# Patient Record
Sex: Female | Born: 1995 | Hispanic: Yes | Marital: Single | Smoking: Never smoker
Health system: Southern US, Community
[De-identification: ages and names within clinical notes are randomized; demographics above are authoritative.]

---
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
## 2018-09-01 ENCOUNTER — Emergency Department (HOSPITAL_COMMUNITY)
Admission: EM | Admit: 2018-09-01 | Discharge: 2018-09-02 | Disposition: A | Discharge status: Home / Self Care | Payer: Self-pay | Attending: Emergency Medicine | Admitting: Emergency Medicine

## 2018-09-01 ENCOUNTER — Other Ambulatory Visit: Payer: Self-pay

## 2018-09-01 ENCOUNTER — Encounter (HOSPITAL_COMMUNITY): Payer: Self-pay | Admitting: Emergency Medicine

## 2018-09-01 DIAGNOSIS — N760 Acute vaginitis: Secondary | ICD-10-CM | POA: Insufficient documentation

## 2018-09-01 DIAGNOSIS — B9689 Other specified bacterial agents as the cause of diseases classified elsewhere: Secondary | ICD-10-CM | POA: Insufficient documentation

## 2018-09-01 NOTE — ED Triage Notes (Addendum)
Patient c/o white vaginal discharge with foul odor x1 week. Reports pelvic pain today. Denies urinary sx. HX BV.

## 2018-09-02 ENCOUNTER — Other Ambulatory Visit: Payer: Self-pay

## 2018-09-02 LAB — RPR: RPR Ser Ql: NONREACTIVE

## 2018-09-02 LAB — WET PREP, GENITAL
SPERM: NONE SEEN
Trich, Wet Prep: NONE SEEN
Yeast Wet Prep HPF POC: NONE SEEN

## 2018-09-02 LAB — GC/CHLAMYDIA PROBE AMP (~~LOC~~) NOT AT ARMC
CHLAMYDIA, DNA PROBE: NEGATIVE
NEISSERIA GONORRHEA: NEGATIVE

## 2018-09-02 LAB — HIV ANTIBODY (ROUTINE TESTING W REFLEX): HIV SCREEN 4TH GENERATION: NONREACTIVE

## 2018-09-02 LAB — POC URINE PREG, ED: Preg Test, Ur: NEGATIVE

## 2018-09-02 MED ORDER — METRONIDAZOLE 500 MG PO TABS: 500.0000 mg | ORAL_TABLET | Freq: Two times a day (BID) | ORAL | 0 refills | Status: DC

## 2018-09-02 MED ORDER — METRONIDAZOLE 500 MG PO TABS
500.0000 mg | ORAL_TABLET | Freq: Once | ORAL | Status: AC
  Administered 2018-09-02: 500 mg via ORAL
  Filled 2018-09-02: qty 1

## 2018-09-02 NOTE — Discharge Instructions (Signed)
Please read the instructions below. Talk with your primary care provider about any new medications. Please schedule an appointment for follow up with your OBGYN or primary care. Finish your antibiotic (Flagyl/Metronidazole) as prescribed. Do not drink alcohol with this medication as it will cause vomiting. You will receive a call from the hospital if your test results come back positive. Avoid sexual activity until you know your test results. If your results come back positive, it is important that you inform all of your sexual partners. Return to the ER for new or worsening symptoms.  ° °

## 2018-09-02 NOTE — ED Provider Notes (Signed)
[facility]-EMERGENCY DEPT Provider Note   CSN: [id number] Arrival date & time: 09/01/18  2218     History   Chief Complaint Chief Complaint  Patient presents with  . Vaginal Discharge    HPI Earlie ServerVictoria Cain is a 22 y.o. female w PMHx bacterial vaginosis, presenting to the ED with 1 week of worsening malodorous vaginal discharge.  She states she has associated vaginal discomfort.  States this feels very typical of her previous episodes of bacterial vaginosis.  She states she gets this frequently.  Denies abdominal pain, urinary symptoms, fever.  She is currently sexually active with female partners without protection.  LMP was mid November.  The history is provided by the patient.    History reviewed. No pertinent past medical history.  There are no active problems to display for this patient.   History reviewed. No pertinent surgical history.   OB History   No obstetric history on file.      Home Medications    Prior to Admission medications   Medication Sig Start Date End Date Taking? Authorizing Provider  metroNIDAZOLE (FLAGYL) 500 MG tablet Take 1 tablet (500 mg total) by mouth 2 (two) times daily. 09/02/18   Alasha Mcguinness, [place] N, PA-C    Family History No family history on file.  Social History Social History   Tobacco Use  . Smoking status: Not on file  Substance Use Topics  . Alcohol use: Not on file  . Drug use: Not on file     Allergies   Patient has no known allergies.   Review of Systems Review of Systems  Constitutional: Negative for fever.  Gastrointestinal: Negative for abdominal pain.  Genitourinary: Positive for vaginal discharge. Negative for dysuria and vaginal bleeding.  All other systems reviewed and are negative.    Physical Exam Updated Vital Signs BP (!) 141/87 (BP Location: Left Arm)   Pulse 88   Temp (!) 97.4 F (36.3 C) (Axillary)   Resp 15   Ht 5\' 2"  (1.575 m)   Wt 81.6 kg   SpO2 100%   BMI  32.92 kg/m   Physical Exam Vitals signs and nursing note reviewed. Exam conducted with a chaperone present (Female nurse tech chaperone present).  Constitutional:      Appearance: She is well-developed.  HENT:     Head: Normocephalic and atraumatic.  Eyes:     Conjunctiva/sclera: Conjunctivae normal.  Cardiovascular:     Rate and Rhythm: Normal rate and regular rhythm.  Pulmonary:     Effort: Pulmonary effort is normal.     Breath sounds: Normal breath sounds.  Abdominal:     General: Bowel sounds are normal.     Palpations: Abdomen is soft.     Tenderness: There is no abdominal tenderness.  Genitourinary:    Cervix: No cervical motion tenderness.     Uterus: Normal.      Adnexa: Right adnexa normal and left adnexa normal.     Comments: Pt with some anxiety regarding pelvic exam, however overall tolerated well. There is a piercing to the mons pubis.  Vaginal erythema present with malodorous white discharge.  Skin:    General: Skin is warm.  Neurological:     Mental Status: She is alert.  Psychiatric:        Behavior: Behavior normal.      ED Treatments / Results  Labs (all labs ordered are listed, but only abnormal results are displayed) Labs Reviewed  WET PREP, GENITAL - Abnormal; Notable  for the following components:      Result Value   Clue Cells Wet Prep HPF POC PRESENT (*)    WBC, Wet Prep HPF POC MODERATE (*)    All other components within normal limits  HIV ANTIBODY (ROUTINE TESTING W REFLEX)  RPR  POC URINE PREG, ED  GC/CHLAMYDIA PROBE AMP ([place]) NOT AT [facility]    EKG None  Radiology No results found.  Procedures Procedures (including critical care time)  Medications Ordered in ED Medications  metroNIDAZOLE (FLAGYL) tablet 500 mg (500 mg Oral Given 09/02/18 0125)     Initial Impression / Assessment and Plan / ED Course  I have reviewed the triage vital signs and the nursing notes.  Pertinent labs & imaging results that were available  during my care of the patient were reviewed by me and considered in my medical decision making (see chart for details).     Pt with hx of recurrent BV, presenting with the same. No systemic sx. Exam with malodorous white discharge. No CMT or adnexal tenderness. Abdomen is benign on exam.  Wet prep with clue cells and moderate white blood cells.  STD culture sent.  Urine pregnant is negative.  Patient deciding to wait for STD cultures to result before any antibiotic treatment.  She is instructed to avoid sexual activity until she knows her results.  Will discharge with Flagyl.  Instructed she take this completely until it is gone.  Instructed to follow-up with her OB/GYN to discuss recurrence of BV and preventative measures.  Agreeable to plan and safe for discharge.  Discussed results, findings, treatment and follow up. Patient advised of return precautions. Patient verbalized understanding and agreed with plan.   Final Clinical Impressions(s) / ED Diagnoses   Final diagnoses:  BV (bacterial vaginosis)    ED Discharge Orders         Ordered    metroNIDAZOLE (FLAGYL) 500 MG tablet  2 times daily     09/02/18 0116           Ramil Edgington, [place] N, PA-C 09/02/18 0141    Gilda Crease, MD 09/02/18 [phone]

## 2018-11-17 ENCOUNTER — Emergency Department (HOSPITAL_COMMUNITY)
Admission: EM | Admit: 2018-11-17 | Discharge: 2018-11-17 | Disposition: A | Discharge status: Home / Self Care | Payer: Medicaid Other | Attending: Emergency Medicine | Admitting: Emergency Medicine

## 2018-11-17 ENCOUNTER — Encounter (HOSPITAL_COMMUNITY): Payer: Self-pay | Admitting: Emergency Medicine

## 2018-11-17 ENCOUNTER — Other Ambulatory Visit: Payer: Self-pay

## 2018-11-17 DIAGNOSIS — E860 Dehydration: Secondary | ICD-10-CM | POA: Insufficient documentation

## 2018-11-17 DIAGNOSIS — Z3A10 10 weeks gestation of pregnancy: Secondary | ICD-10-CM | POA: Diagnosis not present

## 2018-11-17 DIAGNOSIS — O219 Vomiting of pregnancy, unspecified: Secondary | ICD-10-CM | POA: Insufficient documentation

## 2018-11-17 DIAGNOSIS — E876 Hypokalemia: Secondary | ICD-10-CM | POA: Insufficient documentation

## 2018-11-17 DIAGNOSIS — R8279 Other abnormal findings on microbiological examination of urine: Secondary | ICD-10-CM | POA: Diagnosis not present

## 2018-11-17 LAB — COMPREHENSIVE METABOLIC PANEL
ALK PHOS: 64 U/L (ref 38–126)
ALT: 15 U/L (ref 0–44)
AST: 19 U/L (ref 15–41)
Albumin: 4.5 g/dL (ref 3.5–5.0)
Anion gap: 10 (ref 5–15)
BILIRUBIN TOTAL: 0.5 mg/dL (ref 0.3–1.2)
BUN: 7 mg/dL (ref 6–20)
CALCIUM: 9.4 mg/dL (ref 8.9–10.3)
CO2: 23 mmol/L (ref 22–32)
Chloride: 103 mmol/L (ref 98–111)
Creatinine, Ser: 0.5 mg/dL (ref 0.44–1.00)
GFR calc Af Amer: >60 mL/min (ref >60)
Glucose, Bld: 111 mg/dL — ABNORMAL HIGH (ref 70–99)
Potassium: 3.2 mmol/L — ABNORMAL LOW (ref 3.5–5.1)
Sodium: 136 mmol/L (ref 135–145)
TOTAL PROTEIN: 8.2 g/dL — AB (ref 6.5–8.1)

## 2018-11-17 LAB — CBC
HCT: 34.8 % — ABNORMAL LOW (ref 36.0–46.0)
Hemoglobin: 11.3 g/dL — ABNORMAL LOW (ref 12.0–15.0)
MCH: 29.1 pg (ref 26.0–34.0)
MCHC: 32.5 g/dL (ref 30.0–36.0)
MCV: 89.7 fL (ref 80.0–100.0)
Platelets: 283 10*3/uL (ref 150–400)
RBC: 3.88 MIL/uL (ref 3.87–5.11)
RDW: 12.7 % (ref 11.5–15.5)
WBC: 9.7 10*3/uL (ref 4.0–10.5)
nRBC: 0 % (ref 0.0–0.2)

## 2018-11-17 LAB — HCG, QUANTITATIVE, PREGNANCY: HCG, BETA CHAIN, QUANT, S: 203180 m[IU]/mL — AB (ref <5)

## 2018-11-17 LAB — URINALYSIS, ROUTINE W REFLEX MICROSCOPIC
BILIRUBIN URINE: NEGATIVE
Glucose, UA: NEGATIVE mg/dL
HGB URINE DIPSTICK: NEGATIVE
KETONES UR: NEGATIVE mg/dL
Leukocytes,Ua: NEGATIVE
NITRITE: NEGATIVE
Protein, ur: NEGATIVE mg/dL
Specific Gravity, Urine: 1.006 (ref 1.005–1.030)
pH: 6 (ref 5.0–8.0)

## 2018-11-17 LAB — LIPASE, BLOOD: Lipase: 28 U/L (ref 11–51)

## 2018-11-17 MED ORDER — METOCLOPRAMIDE HCL 10 MG PO TABS: 10.0000 mg | ORAL_TABLET | Freq: Four times a day (QID) | ORAL | 0 refills | Status: DC

## 2018-11-17 MED ORDER — ONDANSETRON HCL 4 MG/2ML IJ SOLN
4.0000 mg | Freq: Once | INTRAMUSCULAR | Status: AC
  Administered 2018-11-17: 4 mg via INTRAVENOUS
  Filled 2018-11-17: qty 2

## 2018-11-17 MED ORDER — SODIUM CHLORIDE 0.9 % IV BOLUS
1000.0000 mL | Freq: Once | INTRAVENOUS | Status: AC
  Administered 2018-11-17: 1000 mL via INTRAVENOUS

## 2018-11-17 MED ORDER — POTASSIUM CHLORIDE CRYS ER 20 MEQ PO TBCR
40.0000 meq | EXTENDED_RELEASE_TABLET | Freq: Once | ORAL | Status: AC
  Administered 2018-11-17: 40 meq via ORAL
  Filled 2018-11-17: qty 2

## 2018-11-17 NOTE — Discharge Instructions (Addendum)
Follow-up with your OB/GYN for further evaluation. Return to the ED for worsening symptoms, abdominal pain, abnormal bleeding, lightheadedness, chest pain).

## 2018-11-17 NOTE — ED Provider Notes (Signed)
[facility]-EMERGENCY DEPT Provider Note   CSN: [id number] Arrival date & time: 11/17/18  1642    History   Chief Complaint Chief Complaint  Patient presents with  . Emesis During Pregnancy    HPI Rhonda Cain is a 23 y.o. female who is currently [redacted] weeks pregnant presents to ED with a chief complaint of vomiting. Patient states that she has had no improvement with Unisom or diclegis in the past.  She states that she has nonbloody, nonbilious emesis anytime she tries to eat or drink anything.  Denies any changes to bowel movements, urination, abdominal pain.  She is scheduled for her first OB/GYN appointment in 1 week.  Denies any fevers, shortness of breath.     HPI  No past medical history on file.  There are no active problems to display for this patient.   No past surgical history on file.   OB History    Gravida  1   Para      Term      Preterm      AB      Living        SAB      TAB      Ectopic      Multiple      Live Births               Home Medications    Prior to Admission medications   Medication Sig Start Date End Date Taking? Authorizing Provider  metoCLOPramide (REGLAN) 10 MG tablet Take 1 tablet (10 mg total) by mouth every 6 (six) hours. 11/17/18   Tangie Stay, PA-C  metroNIDAZOLE (FLAGYL) 500 MG tablet Take 1 tablet (500 mg total) by mouth 2 (two) times daily. Patient not taking: Reported on 11/17/2018 09/02/18   Robinson, [place] N, PA-C    Family History No family history on file.  Social History Social History   Tobacco Use  . Smoking status: Not on file  Substance Use Topics  . Alcohol use: Not on file  . Drug use: Not on file     Allergies   Patient has no known allergies.   Review of Systems Review of Systems  Constitutional: Negative for appetite change, chills and fever.  HENT: Negative for ear pain, rhinorrhea, sneezing and sore throat.   Eyes: Negative for photophobia and visual  disturbance.  Respiratory: Negative for cough, chest tightness, shortness of breath and wheezing.   Cardiovascular: Negative for chest pain and palpitations.  Gastrointestinal: Positive for nausea and vomiting. Negative for abdominal pain, blood in stool, constipation and diarrhea.  Genitourinary: Negative for dysuria, hematuria and urgency.  Musculoskeletal: Negative for myalgias.  Skin: Negative for rash.  Neurological: Negative for dizziness, weakness and light-headedness.     Physical Exam Updated Vital Signs BP 112/87   Pulse 84   Temp 98.2 F (36.8 C)   Resp 15   LMP 09/11/2018   SpO2 100%   Physical Exam Vitals signs and nursing note reviewed.  Constitutional:      General: She is not in acute distress.    Appearance: She is well-developed.  HENT:     Head: Normocephalic and atraumatic.     Nose: Nose normal.  Eyes:     General: No scleral icterus.       Right eye: No discharge.        Left eye: No discharge.     Conjunctiva/sclera: Conjunctivae normal.  Neck:     Musculoskeletal: Normal  range of motion and neck supple.  Cardiovascular:     Rate and Rhythm: Normal rate and regular rhythm.     Heart sounds: Normal heart sounds. No murmur. No friction rub. No gallop.   Pulmonary:     Effort: Pulmonary effort is normal. No respiratory distress.     Breath sounds: Normal breath sounds.  Abdominal:     General: Bowel sounds are normal. There is no distension.     Palpations: Abdomen is soft.     Tenderness: There is no abdominal tenderness. There is no guarding.  Musculoskeletal: Normal range of motion.  Skin:    General: Skin is warm and dry.     Findings: No rash.  Neurological:     Mental Status: She is alert.     Motor: No abnormal muscle tone.     Coordination: Coordination normal.      ED Treatments / Results  Labs (all labs ordered are listed, but only abnormal results are displayed) Labs Reviewed  COMPREHENSIVE METABOLIC PANEL - Abnormal;  Notable for the following components:      Result Value   Potassium 3.2 (*)    Glucose, Bld 111 (*)    Total Protein 8.2 (*)    All other components within normal limits  CBC - Abnormal; Notable for the following components:   Hemoglobin 11.3 (*)    HCT 34.8 (*)    All other components within normal limits  URINALYSIS, ROUTINE W REFLEX MICROSCOPIC - Abnormal; Notable for the following components:   APPearance HAZY (*)    All other components within normal limits  HCG, QUANTITATIVE, PREGNANCY - Abnormal; Notable for the following components:   hCG, Beta Chain, Quant, S 203,180 (*)    All other components within normal limits  URINE CULTURE  LIPASE, BLOOD    EKG None  Radiology No results found.  Procedures Procedures (including critical care time)  Medications Ordered in ED Medications  sodium chloride 0.9 % bolus 1,000 mL (0 mLs Intravenous Stopped 11/17/18 2058)  ondansetron (ZOFRAN) injection 4 mg (4 mg Intravenous Given 11/17/18 2013)  potassium chloride SA (K-DUR,KLOR-CON) CR tablet 40 mEq (40 mEq Oral Given 11/17/18 2105)     Initial Impression / Assessment and Plan / ED Course  I have reviewed the triage vital signs and the nursing notes.  Pertinent labs & imaging results that were available during my care of the patient were reviewed by me and considered in my medical decision making (see chart for details).  Clinical Course as of Nov 16 2205  Wynelle Link Nov 17, 2018  1727 Potassium(!): 3.2 [HK]  2117 Patient with improvement in her symptoms with fluids and Zofran given.  She is resting comfortably.  She is requesting p.o. challenge.   [HK]  2155 Leukocytes,Ua: NEGATIVE [HK]  2155 Nitrite: NEGATIVE [HK]    Clinical Course User Index [HK] Dietrich Pates, PA-C       23 year old female who is currently [redacted] weeks pregnant presents to ED for morning sickness and ongoing nausea and vomiting.  Patient is denying any abdominal pain or abnormal vaginal bleeding.  No improvement  with Diclegis taken previously.  On exam she is overall well-appearing.  Abdomen is soft, nontender nondistended.  Vital signs are within normal limits.  Lab work significant for hCG of 200,000, urinalysis unremarkable.  CBC unremarkable.  CMP shows potassium of 3.2 which was repleted orally.  Lipase is normal.  Urine sent for culture although no signs of infection noted.  Patient was  given fluids, Zofran with improvement in her symptoms.  Suspect that the symptoms are due to her pregnancy.  She was able to tolerate a sandwich and ginger ale here.  She has an appointment with her OB/GYN in 1 week.  Will give as needed Reglan.  Will have her return to ED for any severe or worsening symptoms.   Patient is hemodynamically stable, in NAD, and able to ambulate in the ED. Evaluation does not show pathology that would require ongoing emergent intervention or inpatient treatment. I explained the diagnosis to the patient. Pain has been managed and has no complaints prior to discharge. Patient is comfortable with above plan and is stable for discharge at this time. All questions were answered prior to disposition. Strict return precautions for returning to the ED were discussed. Encouraged follow up with PCP.    Portions of this note were generated with Scientist, clinical (histocompatibility and immunogenetics). Dictation errors may occur despite best attempts at proofreading.   Final Clinical Impressions(s) / ED Diagnoses   Final diagnoses:  Nausea and vomiting in pregnancy  Hypokalemia    ED Discharge Orders         Ordered    metoCLOPramide (REGLAN) 10 MG tablet  Every 6 hours     11/17/18 2158           Dietrich Pates, PA-C 11/17/18 2207    Lorre Nick, MD 11/18/18 2303

## 2018-11-17 NOTE — ED Triage Notes (Signed)
Pt reports that she is about [redacted] weeks pregnant and been vomiting. Reports that medication she was prescribed isnt in stock.

## 2018-11-19 LAB — URINE CULTURE

## 2019-04-03 ENCOUNTER — Encounter (HOSPITAL_COMMUNITY): Payer: Self-pay

## 2019-04-03 ENCOUNTER — Emergency Department (HOSPITAL_COMMUNITY)
Admission: EM | Admit: 2019-04-03 | Discharge: 2019-04-03 | Disposition: A | Discharge status: Home / Self Care | Payer: Medicaid Other | Attending: Emergency Medicine | Admitting: Emergency Medicine

## 2019-04-03 ENCOUNTER — Other Ambulatory Visit: Payer: Self-pay

## 2019-04-03 DIAGNOSIS — R51 Headache: Secondary | ICD-10-CM | POA: Insufficient documentation

## 2019-04-03 DIAGNOSIS — Z5321 Procedure and treatment not carried out due to patient leaving prior to being seen by health care provider: Secondary | ICD-10-CM | POA: Diagnosis not present

## 2019-04-03 NOTE — ED Triage Notes (Signed)
Pt stating she has a severe headache that started this morning when she woke up, denies taking any medication at home. Pt also states she is [redacted] weeks pregnant and has not felt her son move since mid day yesterday and now has abdominal pain. Pt denies any vaginal bleeding.

## 2019-04-03 NOTE — ED Notes (Signed)
Patient said that this was taking too long and decided to leave. Charge RN explained leaving AMA, patient understood and decided to leave.

## 2019-04-03 NOTE — ED Provider Notes (Signed)
I went to evaluate the patient who arrived at [facility] and upon entering her room, found it to be empty after signing up for the patient at [facility].  Spoke with the patient's nurse who reports that the patient decided to leave AMA.  I did not see or evaluate this patient prior to her leaving the ER. Charge nurse reviewed AMA guidelines with the patient prior to her leaving the department.    Joanne Gavel, PA-C 04/03/19 Lyndee Leo, April, MD 04/03/19 [phone]

## 2021-01-04 ENCOUNTER — Other Ambulatory Visit: Payer: Self-pay

## 2021-01-04 ENCOUNTER — Emergency Department (HOSPITAL_COMMUNITY)
Admission: EM | Admit: 2021-01-04 | Discharge: 2021-01-04 | Disposition: A | Discharge status: Home / Self Care | Payer: Medicaid Other | Attending: Emergency Medicine | Admitting: Emergency Medicine

## 2021-01-04 ENCOUNTER — Emergency Department (HOSPITAL_COMMUNITY): Payer: Medicaid Other

## 2021-01-04 ENCOUNTER — Encounter (HOSPITAL_COMMUNITY): Payer: Self-pay

## 2021-01-04 DIAGNOSIS — Z3A01 Less than 8 weeks gestation of pregnancy: Secondary | ICD-10-CM | POA: Diagnosis not present

## 2021-01-04 DIAGNOSIS — Z3491 Encounter for supervision of normal pregnancy, unspecified, first trimester: Secondary | ICD-10-CM

## 2021-01-04 DIAGNOSIS — O26851 Spotting complicating pregnancy, first trimester: Secondary | ICD-10-CM | POA: Insufficient documentation

## 2021-01-04 DIAGNOSIS — R109 Unspecified abdominal pain: Secondary | ICD-10-CM

## 2021-01-04 DIAGNOSIS — O219 Vomiting of pregnancy, unspecified: Secondary | ICD-10-CM | POA: Diagnosis not present

## 2021-01-04 DIAGNOSIS — U071 COVID-19: Secondary | ICD-10-CM | POA: Diagnosis not present

## 2021-01-04 DIAGNOSIS — O98511 Other viral diseases complicating pregnancy, first trimester: Secondary | ICD-10-CM | POA: Diagnosis not present

## 2021-01-04 DIAGNOSIS — Z28310 Unvaccinated for covid-19: Secondary | ICD-10-CM | POA: Diagnosis not present

## 2021-01-04 LAB — URINALYSIS, ROUTINE W REFLEX MICROSCOPIC
Bilirubin Urine: NEGATIVE
Glucose, UA: NEGATIVE mg/dL
Hgb urine dipstick: NEGATIVE
Ketones, ur: NEGATIVE mg/dL
Leukocytes,Ua: NEGATIVE
Nitrite: NEGATIVE
Protein, ur: NEGATIVE mg/dL
Specific Gravity, Urine: 1.009 (ref 1.005–1.030)
pH: 7 (ref 5.0–8.0)

## 2021-01-04 LAB — PREGNANCY, URINE: Preg Test, Ur: POSITIVE — AB

## 2021-01-04 LAB — RESP PANEL BY RT-PCR (FLU A&B, COVID) ARPGX2
Influenza A by PCR: NEGATIVE
Influenza B by PCR: NEGATIVE
SARS Coronavirus 2 by RT PCR: POSITIVE — AB

## 2021-01-04 LAB — HCG, QUANTITATIVE, PREGNANCY: hCG, Beta Chain, Quant, S: 22905 m[IU]/mL — ABNORMAL HIGH (ref <5)

## 2021-01-04 MED ORDER — ONDANSETRON 4 MG PO TBDP: 4.0000 mg | ORAL_TABLET | Freq: Three times a day (TID) | ORAL | 0 refills | Status: AC | PRN

## 2021-01-04 MED ORDER — SODIUM CHLORIDE 0.9 % IV SOLN: INTRAVENOUS | Status: AC | PRN

## 2021-01-04 MED ORDER — METHYLPREDNISOLONE SODIUM SUCC 125 MG IJ SOLR: 125.0000 mg | Freq: Once | INTRAMUSCULAR | Status: AC | PRN

## 2021-01-04 MED ORDER — ALBUTEROL SULFATE HFA 108 (90 BASE) MCG/ACT IN AERS: 2.0000 | INHALATION_SPRAY | Freq: Once | RESPIRATORY_TRACT | Status: AC | PRN

## 2021-01-04 MED ORDER — ONDANSETRON HCL 4 MG/2ML IJ SOLN
4.0000 mg | Freq: Once | INTRAMUSCULAR | Status: AC
  Administered 2021-01-04: 4 mg via INTRAVENOUS
  Filled 2021-01-04: qty 2

## 2021-01-04 MED ORDER — FAMOTIDINE IN NACL 20-0.9 MG/50ML-% IV SOLN: 20.0000 mg | Freq: Once | INTRAVENOUS | Status: AC | PRN

## 2021-01-04 MED ORDER — SODIUM CHLORIDE 0.9 % IV BOLUS
1000.0000 mL | Freq: Once | INTRAVENOUS | Status: AC
  Administered 2021-01-04: 1000 mL via INTRAVENOUS

## 2021-01-04 MED ORDER — DIPHENHYDRAMINE HCL 50 MG/ML IJ SOLN: 50.0000 mg | Freq: Once | INTRAMUSCULAR | Status: AC | PRN

## 2021-01-04 MED ORDER — ACETAMINOPHEN 325 MG PO TABS
650.0000 mg | ORAL_TABLET | Freq: Once | ORAL | Status: AC
  Administered 2021-01-04: 650 mg via ORAL
  Filled 2021-01-04: qty 2

## 2021-01-04 MED ORDER — BEBTELOVIMAB 175 MG/2 ML IV (EUA)
175.0000 mg | Freq: Once | INTRAMUSCULAR | Status: AC
  Administered 2021-01-04: 175 mg via INTRAVENOUS
  Filled 2021-01-04: qty 2

## 2021-01-04 MED ORDER — EPINEPHRINE 0.3 MG/0.3ML IJ SOAJ: 0.3000 mg | Freq: Once | INTRAMUSCULAR | Status: AC | PRN

## 2021-01-04 NOTE — ED Notes (Signed)
Pt was seen returning from the bathroom. Pt informed the need to stay in her room due to being covid positive and that a bedside commode will be brought to her.

## 2021-01-04 NOTE — ED Provider Notes (Signed)
[facility]-EMERGENCY DEPT Provider Note   CSN: [id number] Arrival date & time: 01/04/21  0417     History Chief Complaint  Patient presents with  . Vaginal Bleeding  . Vomiting  . Headache    Rhonda Cain is a 25 y.o. female.  HPI     This is a 25 year old G5, P17 female currently 5 to [redacted] weeks pregnant with last menstrual period November 23, 2020 who presents with nausea, vomiting, headache, and vaginal spotting.  Patient reports 2 to 3 days history of worsening symptoms.  She states she is unable to keep anything down.  She has noticed light vaginal spotting and abdominal cramping.  This does not lateralize.  She also reports headache.  She reports that she had a positive pregnancy test 2 weeks ago but has not seen an OB/GYN.  She denies any urinary symptoms but does report she was recently treated for UTI.  She reports body aches but did not note any fevers.  She was 100.2 upon arrival.  No known sick contacts or COVID exposures.  She is not vaccinated.  History reviewed. No pertinent past medical history.  There are no problems to display for this patient.   History reviewed. No pertinent surgical history.   OB History    Gravida  1   Para      Term      Preterm      AB      Living        SAB      IAB      Ectopic      Multiple      Live Births              No family history on file.  Social History   Tobacco Use  . Smoking status: Never Smoker  . Smokeless tobacco: Never Used    Home Medications Prior to Admission medications   Medication Sig Start Date End Date Taking? Authorizing Provider  metoCLOPramide (REGLAN) 10 MG tablet Take 1 tablet (10 mg total) by mouth every 6 (six) hours. 11/17/18   Khatri, Hina, PA-C  metroNIDAZOLE (FLAGYL) 500 MG tablet Take 1 tablet (500 mg total) by mouth 2 (two) times daily. Patient not taking: Reported on 11/17/2018 09/02/18   Robinson, [place] N, PA-C    Allergies    Patient has no  known allergies.  Review of Systems   Review of Systems  Constitutional: Positive for chills and fever.  Respiratory: Negative for shortness of breath.   Cardiovascular: Negative for chest pain.  Gastrointestinal: Positive for abdominal pain, nausea and vomiting.  Genitourinary: Positive for vaginal bleeding.  Neurological: Positive for headaches.  All other systems reviewed and are negative.   Physical Exam Updated Vital Signs BP 133/73   Pulse (!) 112   Temp 100.2 F (37.9 C) (Oral)   Resp 18   Ht 1.575 m (5\' 2" )   Wt 90.7 kg   SpO2 100%   BMI 36.58 kg/m   Physical Exam Vitals and nursing note reviewed.  Constitutional:      Appearance: She is well-developed. She is not ill-appearing.  HENT:     Head: Normocephalic and atraumatic.  Eyes:     Pupils: Pupils are equal, round, and reactive to light.  Cardiovascular:     Rate and Rhythm: Normal rate and regular rhythm.     Heart sounds: Normal heart sounds.  Pulmonary:     Effort: Pulmonary effort is normal. No respiratory  distress.     Breath sounds: No wheezing.  Abdominal:     General: Bowel sounds are normal.     Palpations: Abdomen is soft.     Tenderness: There is abdominal tenderness.     Comments: Diffuse lower abdominal tenderness to palpation, nonlateralizing, no rebound or guarding  Musculoskeletal:     Cervical back: Neck supple.  Skin:    General: Skin is warm and dry.  Neurological:     Mental Status: She is alert and oriented to person, place, and time.  Psychiatric:        Mood and Affect: Mood normal.     ED Results / Procedures / Treatments   Labs (all labs ordered are listed, but only abnormal results are displayed) Labs Reviewed  RESP PANEL BY RT-PCR (FLU A&B, COVID) ARPGX2 - Abnormal; Notable for the following components:      Result Value   SARS Coronavirus 2 by RT PCR POSITIVE (*)    All other components within normal limits  URINALYSIS, ROUTINE W REFLEX MICROSCOPIC - Abnormal;  Notable for the following components:   APPearance HAZY (*)    All other components within normal limits  HCG, QUANTITATIVE, PREGNANCY - Abnormal; Notable for the following components:   hCG, Beta Chain, Quant, S 22,905 (*)    All other components within normal limits  PREGNANCY, URINE - Abnormal; Notable for the following components:   Preg Test, Ur POSITIVE (*)    All other components within normal limits  URINE CULTURE    EKG None  Radiology US OB LESS THAN 14 WEEKS WITH OB TRANSVAGINAL  Result Date: 01/04/2021 CLINICAL DATA:  Nausea vomiting.  Spotting.  Abdominal pain. EXAM: OBSTETRIC <14 WK US AND TRANSVAGINAL OB US TECHNIQUE: Both transabdominal and transvaginal ultrasound examinations were performed for complete evaluation of the gestation as well as the maternal uterus, adnexal regions, and pelvic cul-de-sac. Transvaginal technique was performed to assess early pregnancy. COMPARISON:  No prior. FINDINGS: Intrauterine gestational sac: Single Yolk sac:  Present Embryo:  Present Cardiac Activity: Present Heart Rate: 105 bpm CRL: 2 mm   5 w   5 d                  US EDC: 09/01/2021 Subchorionic hemorrhage:  None visualized. Maternal uterus/adnexae: Small amount of free pelvic fluid. Otherwise unremarkable. IMPRESSION: 1.  Single viable intrauterine pregnancy at 5 weeks 5 days. 2.  Small amount of free fluid. Electronically Signed   By: Maisie Fushomas  Register   On: 01/04/2021 06:04    Procedures Procedures   Medications Ordered in ED Medications  bebtelovimab EUA injection SOLN 175 mg (has no administration in time range)  0.9 %  sodium chloride infusion (has no administration in time range)  diphenhydrAMINE (BENADRYL) injection 50 mg (has no administration in time range)  famotidine (PEPCID) IVPB 20 mg premix (has no administration in time range)  methylPREDNISolone sodium succinate (SOLU-MEDROL) 125 mg/2 mL injection 125 mg (has no administration in time range)  albuterol (VENTOLIN  HFA) 108 (90 Base) MCG/ACT inhaler 2 puff (has no administration in time range)  EPINEPHrine (EPI-PEN) injection 0.3 mg (has no administration in time range)  sodium chloride 0.9 % bolus 1,000 mL (1,000 mLs Intravenous New Bag/Given 01/04/21 0520)  ondansetron (ZOFRAN) injection 4 mg (4 mg Intravenous Given 01/04/21 0520)  acetaminophen (TYLENOL) tablet 650 mg (650 mg Oral Given 01/04/21 0520)    ED Course  I have reviewed the triage vital signs and the nursing notes.  Pertinent labs & imaging results that were available during my care of the patient were reviewed by me and considered in my medical decision making (see chart for details).    MDM Rules/Calculators/A&P                          Patient presents with vaginal spotting, chills, fever, headache.  She is overall nontoxic.  Temperature 100.2.  Pulse rate in the 110s.  She is nontoxic on exam.  She has no lateralizing abdominal tenderness.  However, given abnormal spotting and abdominal pain, will obtain an ultrasound to ensure intrauterine pregnancy.  Labs obtained including beta hCG, COVID and flu testing, and urinalysis.  Urine without evidence of UTI.  Beta-hCG greater than 22,000.  Patient is COVID-positive.  She is unvaccinated.  She is within the window for monoclonal antibody therapy.  She is agreeable to receiving this.  This was ordered through the pharmacy.  Patient was advised to quarantine information.  She was also given return precautions.  She was given return precautions for both COVID-19 and worsening vaginal bleeding with pregnancy.   Final Clinical Impression(s) / ED Diagnoses Final diagnoses:  Abdominal pain  COVID-19  First trimester pregnancy    Rx / DC Orders ED Discharge Orders         Ordered    SODIUM CHLORIDE 0.9 % IV SOLN  As needed        01/04/21 0618    DIPHENHYDRAMINE HCL 50 MG/ML IJ SOLN  Once PRN        01/04/21 0618    FAMOTIDINE IN NACL 20-0.9 MG/50ML-% IV SOLN  Once PRN        01/04/21 0618     METHYLPREDNISOLONE SODIUM SUCC 125 MG IJ SOLR  Once PRN        01/04/21 0618    ALBUTEROL SULFATE HFA 108 (90 BASE) MCG/ACT IN AERS  Once PRN        01/04/21 0618    EPINEPHRINE 0.3 MG/0.3ML IJ SOAJ  Once PRN        01/04/21 0618    Hypersensitivity GRADE 1: Transient flushing or rash, or drug fever < 100.4 F       Comments: As needed  Hold infusion for at least 30 minutes Restart infusion at 50% infusion rate if asymptomatic   01/04/21 5638    Hypersensitivity GRADE 2: Rash, flushing, urticaria, dyspnea, or drug fever = or > 100.4 F       Comments: As needed  Hold infusion for 30 minutes Give medications as ordered   01/04/21 7564    Hypersensitivity GRADE 3: symptomatic bronchospasm, with or without urticaria, parenteral medication management indicated, allergy-related edema/angioedema, or hypotension       Comments: Routine, As needed For Until specified Hold infusion for at least 30 minutes Give medications as ordered   01/04/21 0618    Hypersensitivity GRADE 4: Anaphylaxis       Comments: As needed  Hold infusion Give medications as ordered Initiate fetal monitoring/fetal heart rate assessment as appropriate for gestational age Call MD   01/04/21 0618    Fetal monitoring       Comments: For Grade 4 hypersensitivity reaction and gestational age greater than or equal to 23 weeks   01/04/21 0618    Assess fetal heart tones       Comments: For Grade 4 hypersensitivity reactions and gestational age less than 23 weeks   01/04/21 0618  Shon Baton, MD 01/04/21 [phone]

## 2021-01-04 NOTE — ED Provider Notes (Signed)
  Physical Exam  BP 135/77 (BP Location: Left Arm)   Pulse (!) 106   Temp 100.2 F (37.9 C) (Oral)   Resp 17   Ht 5\' 2"  (1.575 m)   Wt 90.7 kg   SpO2 99%   BMI 36.58 kg/m   Physical Exam  ED Course/Procedures     Procedures  MDM  Awaiting monoclonal antibody infusion. Completed monoclonal antibody infusion.  She will be discharged home.       , MD 01/04/21 [phone]

## 2021-01-04 NOTE — Discharge Instructions (Addendum)
You were seen today and found to be COVID-19 positive.  Your ultrasound confirms a intrauterine pregnancy at 5 weeks and 5 days.  You received monoclonal antibody therapy.  You will need to quarantine for 10 days and be symptom-free for greater than 24 hours.  Regarding your pregnancy, if you develop worsening abdominal pain, passage of clots or worsening bleeding, you need to be reevaluated immediately.

## 2021-01-04 NOTE — ED Triage Notes (Signed)
Pt reports nausea, vomiting and headache for roughly 4 days. Has been unable to hold anything down for 3. Pt sts spotting for 4 days also. Reports being approximately 5-[redacted] weeks pregnant.

## 2021-01-05 LAB — URINE CULTURE

## 2021-06-26 ENCOUNTER — Other Ambulatory Visit: Payer: Self-pay

## 2021-06-26 ENCOUNTER — Emergency Department (HOSPITAL_COMMUNITY)
Admission: EM | Admit: 2021-06-26 | Discharge: 2021-06-26 | Disposition: A | Discharge status: Home / Self Care | Payer: Medicaid Other | Attending: Emergency Medicine | Admitting: Emergency Medicine

## 2021-06-26 ENCOUNTER — Emergency Department (HOSPITAL_COMMUNITY): Payer: Medicaid Other

## 2021-06-26 ENCOUNTER — Encounter (HOSPITAL_COMMUNITY): Payer: Self-pay

## 2021-06-26 DIAGNOSIS — O4690 Antepartum hemorrhage, unspecified, unspecified trimester: Secondary | ICD-10-CM

## 2021-06-26 DIAGNOSIS — O200 Threatened abortion: Secondary | ICD-10-CM | POA: Diagnosis present

## 2021-06-26 DIAGNOSIS — O2341 Unspecified infection of urinary tract in pregnancy, first trimester: Secondary | ICD-10-CM | POA: Diagnosis not present

## 2021-06-26 DIAGNOSIS — Z3A00 Weeks of gestation of pregnancy not specified: Secondary | ICD-10-CM | POA: Diagnosis not present

## 2021-06-26 LAB — CBC WITH DIFFERENTIAL/PLATELET
Abs Immature Granulocytes: 0.03 10*3/uL (ref 0.00–0.07)
Basophils Absolute: 0 10*3/uL (ref 0.0–0.1)
Basophils Relative: 0 %
Eosinophils Absolute: 0.2 10*3/uL (ref 0.0–0.5)
Eosinophils Relative: 2 %
HCT: 32.8 % — ABNORMAL LOW (ref 36.0–46.0)
Hemoglobin: 10.7 g/dL — ABNORMAL LOW (ref 12.0–15.0)
Immature Granulocytes: 0 %
Lymphocytes Relative: 29 %
Lymphs Abs: 2.7 10*3/uL (ref 0.7–4.0)
MCH: 28.8 pg (ref 26.0–34.0)
MCHC: 32.6 g/dL (ref 30.0–36.0)
MCV: 88.4 fL (ref 80.0–100.0)
Monocytes Absolute: 0.6 10*3/uL (ref 0.1–1.0)
Monocytes Relative: 7 %
Neutro Abs: 5.7 10*3/uL (ref 1.7–7.7)
Neutrophils Relative %: 62 %
Platelets: 311 10*3/uL (ref 150–400)
RBC: 3.71 MIL/uL — ABNORMAL LOW (ref 3.87–5.11)
RDW: 13.8 % (ref 11.5–15.5)
WBC: 9.3 10*3/uL (ref 4.0–10.5)
nRBC: 0 % (ref 0.0–0.2)

## 2021-06-26 LAB — URINALYSIS, ROUTINE W REFLEX MICROSCOPIC
Bacteria, UA: NONE SEEN
Bilirubin Urine: NEGATIVE
Glucose, UA: NEGATIVE mg/dL
Hgb urine dipstick: NEGATIVE
Ketones, ur: NEGATIVE mg/dL
Nitrite: NEGATIVE
Protein, ur: NEGATIVE mg/dL
Specific Gravity, Urine: 1.021 (ref 1.005–1.030)
pH: 5 (ref 5.0–8.0)

## 2021-06-26 LAB — BASIC METABOLIC PANEL
Anion gap: 8 (ref 5–15)
BUN: 10 mg/dL (ref 6–20)
CO2: 22 mmol/L (ref 22–32)
Calcium: 9.1 mg/dL (ref 8.9–10.3)
Chloride: 107 mmol/L (ref 98–111)
Creatinine, Ser: 0.57 mg/dL (ref 0.44–1.00)
GFR, Estimated: >60 mL/min (ref >60)
Glucose, Bld: 99 mg/dL (ref 70–99)
Potassium: 3.9 mmol/L (ref 3.5–5.1)
Sodium: 137 mmol/L (ref 135–145)

## 2021-06-26 LAB — HCG, QUANTITATIVE, PREGNANCY: hCG, Beta Chain, Quant, S: 1286 m[IU]/mL — ABNORMAL HIGH (ref <5)

## 2021-06-26 NOTE — ED Triage Notes (Signed)
Pt reports that she found out she was pregnant a few weeks ago, unknown of exact LMP and she has been bleeding and cramping for 2 days.

## 2021-06-26 NOTE — Discharge Instructions (Signed)
Call your OB/GYN tomorrow to let them know you are seen here today.  You need follow-up within the next 48 hours for repeat hCG levels.  As discussed in the room your ultrasound showed a gestational sac however no fetal cardiac activity or yolk sac.  This ultrasound essentially nonconclusive which is why you need close OB/GYN follow-up.  Your urine did show infection.  I have started you on antibiotics.  Return for new or worsening symptoms.

## 2021-06-26 NOTE — ED Provider Notes (Signed)
Emergency Medicine Provider Triage Evaluation Note  Rhonda Cain , a 25 y.o. female  was evaluated in triage.  Pt complains of vaginal bleeding for 3 days.  She recently had a positive at-home pregnancy test 2 weeks ago.  Has not established care with GYN yet.  Unknown exact LMP.  Also endorses intermittent bilateral lower abdominal cramping that started yesterday.  She is bleeding through 3-4 panty liners daily.  Review of Systems  Positive: Vaginal bleeding, abdominal cramping Negative: Fevers, chills, dysuria  Physical Exam  BP (!) 158/85 (BP Location: Right Arm)   Pulse 100   Temp 99.3 F (37.4 C) (Oral)   Resp 16   Ht 5\' 3"  (1.6 m)   Wt 95.3 kg   SpO2 100%   BMI 37.20 kg/m  Gen:   Awake, no distress   Resp:  Normal effort  MSK:   Moves extremities without difficulty  Other:    Medical Decision Making  Medically screening exam initiated at 2:21 PM.  Appropriate orders placed.  Elexis Pollak was informed that the remainder of the evaluation will be completed by another provider, this initial triage assessment does not replace that evaluation, and the importance of remaining in the ED until their evaluation is complete.     Earlie Server 06/26/21 1423    Long12/09/22, MD 06/27/21 [phone]

## 2021-06-26 NOTE — ED Notes (Signed)
US at bedside

## 2021-06-26 NOTE — ED Provider Notes (Signed)
[facility]-EMERGENCY DEPT Provider Note   CSN: [id number] Arrival date & time: 06/26/21  1350    History Chief Complaint  Patient presents with   Vaginal Bleeding    Rhonda Cain is a 25 y.o. female with recent past medical history who presents for evaluation of vaginal bleeding in setting of positive at home pregnancy test.  Unsure last menstrual cycle of these are regular.  Had a positive test 2 weeks ago.  Previous pregnancies she is followed by [facility].  Not actively being followed.  She is G6, P1 currently.  She has had some intermittent lower abdominal cramping and some intermittent vaginal bleeding.  She has been wearing panty liners.  Changing approximately 3 times daily.  She has no current abdominal pain.  No fever, chills, emesis, back pain, dysuria, hematuria, lightheadedness or dizziness.  Denies additional aggravating or alleviating factors  History obtained from patient and past medical records.  No interpreter used.  Chart review MBT O positive>> Does not need Rhogam  HPI     History reviewed. No pertinent past medical history.  There are no problems to display for this patient.   History reviewed. No pertinent surgical history.   OB History     Gravida  1   Para      Term      Preterm      AB      Living         SAB      IAB      Ectopic      Multiple      Live Births              History reviewed. No pertinent family history.  Social History   Tobacco Use   Smoking status: Never   Smokeless tobacco: Never    Home Medications Prior to Admission medications   Medication Sig Start Date End Date Taking? Authorizing Provider  ondansetron (ZOFRAN ODT) 4 MG disintegrating tablet Take 1 tablet (4 mg total) by mouth every 8 (eight) hours as needed for nausea or vomiting. 01/04/21   Koleen Distance, MD    Allergies    Patient has no known allergies.  Review of Systems   Review of Systems   Constitutional: Negative.   HENT: Negative.    Respiratory: Negative.    Cardiovascular: Negative.   Gastrointestinal:  Positive for abdominal pain. Negative for abdominal distention, constipation, diarrhea, nausea, rectal pain and vomiting.  Genitourinary:  Positive for menstrual problem. Negative for decreased urine volume, difficulty urinating, dysuria, enuresis, flank pain, frequency, genital sores, hematuria, pelvic pain, urgency, vaginal bleeding, vaginal discharge and vaginal pain.  Musculoskeletal: Negative.   Skin: Negative.   Neurological: Negative.   All other systems reviewed and are negative.  Physical Exam Updated Vital Signs BP (!) 158/85 (BP Location: Right Arm)   Pulse 100   Temp 99.3 F (37.4 C) (Oral)   Resp 16   Ht 5\' 3"  (1.6 m)   Wt 95.3 kg   SpO2 100%   BMI 37.20 kg/m   Physical Exam Vitals and nursing note reviewed.  Constitutional:      General: She is not in acute distress.    Appearance: She is well-developed. She is not ill-appearing, toxic-appearing or diaphoretic.  HENT:     Head: Normocephalic and atraumatic.     Nose: Nose normal.     Mouth/Throat:     Mouth: Mucous membranes are moist.  Eyes:  Pupils: Pupils are equal, round, and reactive to light.  Cardiovascular:     Rate and Rhythm: Normal rate.     Pulses: Normal pulses.     Heart sounds: Normal heart sounds.  Pulmonary:     Effort: No respiratory distress.  Abdominal:     General: Bowel sounds are normal. There is no distension.     Palpations: Abdomen is soft.  Genitourinary:    Comments: Declined Musculoskeletal:        General: Normal range of motion.     Cervical back: Normal range of motion.  Skin:    General: Skin is warm and dry.  Neurological:     General: No focal deficit present.     Mental Status: She is alert.  Psychiatric:        Mood and Affect: Mood normal.    ED Results / Procedures / Treatments   Labs (all labs ordered are listed, but only abnormal  results are displayed) Labs Reviewed  URINALYSIS, ROUTINE W REFLEX MICROSCOPIC - Abnormal; Notable for the following components:      Result Value   APPearance CLOUDY (*)    Leukocytes,Ua LARGE (*)    All other components within normal limits  HCG, QUANTITATIVE, PREGNANCY - Abnormal; Notable for the following components:   hCG, Beta Chain, Quant, S 1,286 (*)    All other components within normal limits  CBC WITH DIFFERENTIAL/PLATELET - Abnormal; Notable for the following components:   RBC 3.71 (*)    Hemoglobin 10.7 (*)    HCT 32.8 (*)    All other components within normal limits  BASIC METABOLIC PANEL    EKG None  Radiology US OB Comp < 14 Wks  Result Date: 06/26/2021 CLINICAL DATA:  Bleeding and pelvic pain for 3 days, beta HCG 1,286 EXAM: OBSTETRIC <14 WK [place] AND TRANSVAGINAL OB US TECHNIQUE: Both transabdominal and transvaginal ultrasound examinations were performed for complete evaluation of the gestation as well as the maternal uterus, adnexal regions, and pelvic cul-de-sac. Transvaginal technique was performed to assess early pregnancy. COMPARISON:  None. FINDINGS: Intrauterine gestational sac: Single Yolk sac:  Not Visualized. Embryo:  Not Visualized. Cardiac Activity: Not Visualized. MSD: Too small for accurate measurement Maternal uterus/adnexae: Right ovary measures 3.3 x 2.0 x 3.0 cm and the left ovary measures 3.7 x 1.9 x 2.1 cm. Trace pelvic free fluid. IMPRESSION: 1. Probable early intrauterine gestational sac, but no yolk sac, fetal pole, or cardiac activity yet visualized. Recommend follow-up quantitative B-HCG levels and follow-up US in 14 days to assess viability. This recommendation follows SRU consensus guidelines: Diagnostic Criteria for Nonviable Pregnancy Early in the First Trimester. Malva Limes Med 2013; 161:0960-45. 2. Trace pelvic free fluid. Electronically Signed   By: Sharlet Salina M.D.   On: 06/26/2021 16:45   US OB Transvaginal  Result Date:  06/26/2021 CLINICAL DATA:  Bleeding and pelvic pain for 3 days, beta HCG 1,286 EXAM: OBSTETRIC <14 WK [place] AND TRANSVAGINAL OB US TECHNIQUE: Both transabdominal and transvaginal ultrasound examinations were performed for complete evaluation of the gestation as well as the maternal uterus, adnexal regions, and pelvic cul-de-sac. Transvaginal technique was performed to assess early pregnancy. COMPARISON:  None. FINDINGS: Intrauterine gestational sac: Single Yolk sac:  Not Visualized. Embryo:  Not Visualized. Cardiac Activity: Not Visualized. MSD: Too small for accurate measurement Maternal uterus/adnexae: Right ovary measures 3.3 x 2.0 x 3.0 cm and the left ovary measures 3.7 x 1.9 x 2.1 cm. Trace pelvic free fluid. IMPRESSION: 1.  Probable early intrauterine gestational sac, but no yolk sac, fetal pole, or cardiac activity yet visualized. Recommend follow-up quantitative B-HCG levels and follow-up US in 14 days to assess viability. This recommendation follows SRU consensus guidelines: Diagnostic Criteria for Nonviable Pregnancy Early in the First Trimester. Malva Limes Med 2013; 725:3664-40. 2. Trace pelvic free fluid. Electronically Signed   By: Sharlet Salina M.D.   On: 06/26/2021 16:45    Procedures Procedures   Medications Ordered in ED Medications - No data to display  ED Course  I have reviewed the triage vital signs and the nursing notes.  Pertinent labs & imaging results that were available during my care of the patient were reviewed by me and considered in my medical decision making (see chart for details).  Here for evaluation of lower abdominal cramping and vaginal bleeding in setting of positive home pregnancy test.  She is afebrile, nonseptic, not ill-appearing.  She has had occasional spotting over the last 3 days, denies any current bleeding or pain.  No lightheadedness or dizziness.  Unsure last LMP due to irregular cycles  Work-up started from triage which I personally reviewed and  interpreted:  CBC without leukocytosis, hemoglobin 10.7, similar to prior Metabolic panel without electrolyte, renal or liver normality hCG quant 1286 UA positive for infection however dirty catch, will treat given pos preg [place] with probable intrauterine gestational sac however no yolk sac, fetal cardiac activity  Patient denied pelvic exam.  Discussed risk versus benefit.  Continues to decline.  Discussed ultrasound lab findings.  She will need close follow-up with OB/GYN for repeat trending of hCG, repeat ultrasound.   Blood type O+ does not need RhoGAM  Discussed strict return precautions.  Patient voiced understanding is agreeable for follow-up.  On repeat exam patient does not have a surgical abdomin and there are no peritoneal signs.  No indication of appendicitis, bowel obstruction, bowel perforation, cholecystitis, diverticulitis, PID, TOA or ectopic pregnancy.    The patient has been appropriately medically screened and/or stabilized in the ED. I have low suspicion for any other emergent medical condition which would require further screening, evaluation or treatment in the ED or require inpatient management.  Patient is hemodynamically stable and in no acute distress.  Patient able to ambulate in department prior to ED.  Evaluation does not show acute pathology that would require ongoing or additional emergent interventions while in the emergency department or further inpatient treatment.  I have discussed the diagnosis with the patient and answered all questions.  Pain is been managed while in the emergency department and patient has no further complaints prior to discharge.  Patient is comfortable with plan discussed in room and is stable for discharge at this time.  I have discussed strict return precautions for returning to the emergency department.  Patient was encouraged to follow-up with PCP/specialist refer to at discharge.      MDM Rules/Calculators/A&P                             Final Clinical Impression(s) / ED Diagnoses Final diagnoses:  Vaginal bleeding in pregnancy  Threatened miscarriage  Urinary tract infection in mother during first trimester of pregnancy    Rx / DC Orders ED Discharge Orders     None        Kamaryn Grimley A, PA-C 06/26/21 1721    Bethann Berkshire, MD 06/27/21 1106

## 2021-07-11 ENCOUNTER — Emergency Department (HOSPITAL_COMMUNITY): Payer: Medicaid Other

## 2021-07-11 ENCOUNTER — Encounter (HOSPITAL_COMMUNITY): Payer: Self-pay | Admitting: Emergency Medicine

## 2021-07-11 ENCOUNTER — Other Ambulatory Visit: Payer: Self-pay

## 2021-07-11 DIAGNOSIS — O30001 Twin pregnancy, unspecified number of placenta and unspecified number of amniotic sacs, first trimester: Secondary | ICD-10-CM | POA: Insufficient documentation

## 2021-07-11 DIAGNOSIS — O208 Other hemorrhage in early pregnancy: Secondary | ICD-10-CM | POA: Insufficient documentation

## 2021-07-11 DIAGNOSIS — Z5321 Procedure and treatment not carried out due to patient leaving prior to being seen by health care provider: Secondary | ICD-10-CM | POA: Diagnosis not present

## 2021-07-11 DIAGNOSIS — Z3A01 Less than 8 weeks gestation of pregnancy: Secondary | ICD-10-CM | POA: Insufficient documentation

## 2021-07-11 LAB — CBC WITH DIFFERENTIAL/PLATELET
Abs Immature Granulocytes: 0.03 10*3/uL (ref 0.00–0.07)
Basophils Absolute: 0 10*3/uL (ref 0.0–0.1)
Basophils Relative: 0 %
Eosinophils Absolute: 0.2 10*3/uL (ref 0.0–0.5)
Eosinophils Relative: 3 %
HCT: 32.7 % — ABNORMAL LOW (ref 36.0–46.0)
Hemoglobin: 10.9 g/dL — ABNORMAL LOW (ref 12.0–15.0)
Immature Granulocytes: 0 %
Lymphocytes Relative: 25 %
Lymphs Abs: 2.2 10*3/uL (ref 0.7–4.0)
MCH: 28.8 pg (ref 26.0–34.0)
MCHC: 33.3 g/dL (ref 30.0–36.0)
MCV: 86.5 fL (ref 80.0–100.0)
Monocytes Absolute: 0.7 10*3/uL (ref 0.1–1.0)
Monocytes Relative: 8 %
Neutro Abs: 5.6 10*3/uL (ref 1.7–7.7)
Neutrophils Relative %: 64 %
Platelets: 297 10*3/uL (ref 150–400)
RBC: 3.78 MIL/uL — ABNORMAL LOW (ref 3.87–5.11)
RDW: 13.7 % (ref 11.5–15.5)
WBC: 8.9 10*3/uL (ref 4.0–10.5)
nRBC: 0 % (ref 0.0–0.2)

## 2021-07-11 LAB — COMPREHENSIVE METABOLIC PANEL
ALT: 14 U/L (ref 0–44)
AST: 15 U/L (ref 15–41)
Albumin: 4 g/dL (ref 3.5–5.0)
Alkaline Phosphatase: 71 U/L (ref 38–126)
Anion gap: 7 (ref 5–15)
BUN: 9 mg/dL (ref 6–20)
CO2: 22 mmol/L (ref 22–32)
Calcium: 9.2 mg/dL (ref 8.9–10.3)
Chloride: 103 mmol/L (ref 98–111)
Creatinine, Ser: 0.5 mg/dL (ref 0.44–1.00)
GFR, Estimated: >60 mL/min (ref >60)
Glucose, Bld: 96 mg/dL (ref 70–99)
Potassium: 4 mmol/L (ref 3.5–5.1)
Sodium: 132 mmol/L — ABNORMAL LOW (ref 135–145)
Total Bilirubin: 0.5 mg/dL (ref 0.3–1.2)
Total Protein: 7.8 g/dL (ref 6.5–8.1)

## 2021-07-11 LAB — HCG, QUANTITATIVE, PREGNANCY: hCG, Beta Chain, Quant, S: 72555 m[IU]/mL — ABNORMAL HIGH (ref <5)

## 2021-07-11 NOTE — ED Triage Notes (Signed)
Patient states since being seen, she has been spotting, changing her pad 2-3 times a day, but pad is not saturated. Patient states vomiting over the last few days, which she did have with her first pregnancy and intermittent cramping.

## 2021-07-11 NOTE — ED Provider Notes (Signed)
Emergency Medicine Provider Triage Evaluation Note  Rhonda Cain , a 25 y.o. [id number]  female  was evaluated in triage.  Pt complains of abdominal cramping as well as spotting.  States her symptoms have been ongoing since she was evaluated on October 9.  Reports additional nausea/vomiting.  Physical Exam  BP (!) 141/86 (BP Location: Right Arm)   Pulse 88   Temp 98.5 F (36.9 C)   Resp 17   Ht 5\' 3"  (1.6 m)   Wt 97.5 kg   SpO2 98%   BMI 38.09 kg/m  Gen:   Awake, no distress   Resp:  Normal effort  MSK:   Moves extremities without difficulty  Other:    Medical Decision Making  Medically screening exam initiated at 10:14 PM.  Appropriate orders placed.  Rhonda Cain was informed that the remainder of the evaluation will be completed by another provider, this initial triage assessment does not replace that evaluation, and the importance of remaining in the ED until their evaluation is complete.   Earlie Server, PA-C 07/11/21 2216    2217, MD 07/11/21 [phone]

## 2021-07-12 ENCOUNTER — Emergency Department (HOSPITAL_COMMUNITY)
Admission: EM | Admit: 2021-07-12 | Discharge: 2021-07-12 | Disposition: A | Discharge status: Home / Self Care | Payer: Medicaid Other | Attending: Physician Assistant | Admitting: Physician Assistant

## 2021-07-13 ENCOUNTER — Ambulatory Visit: Payer: Self-pay | Admitting: *Deleted

## 2021-07-13 NOTE — Telephone Encounter (Signed)
[phone] Pt wants to discuss her lab results, says she has a PCP but wants to speak to a nurse about her results   Called patient to review lab results. Patient requesting to clarify ultrasound results that she read twin A and twin B. Reviewed with patient that she is reading correct impression in ultrasound results from My Chart viewed on 07/13/21 at  2:39 pm. Patient verbalized understanding and reports she is still having vaginal spotting , no vomiting  at this time. Instructed patient if symptoms worsen to go to ED. Patient verbalized understanding and to follow up with OBGYN.

## 2022-07-27 IMAGING — US US OB < 14 WEEKS - US OB TV
1 series · 14 of 28 positions shown · non-contrast
Comparison: No prior.

CLINICAL DATA: Nausea vomiting.  Spotting.  Abdominal pain.

EXAM:
OBSTETRIC <14 WK US AND TRANSVAGINAL OB US
TECHNIQUE: Both transabdominal and transvaginal ultrasound examinations were
performed for complete evaluation of the gestation as well as the
maternal uterus, adnexal regions, and pelvic cul-de-sac.
Transvaginal technique was performed to assess early pregnancy.

[Series 1: us ob < 14 weeks - us ob tv · 14 of 46 slices shown]
[im 2/46]
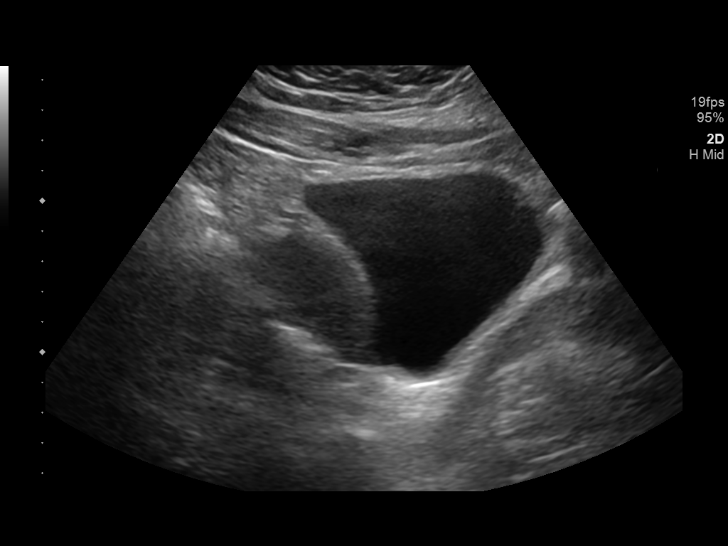
[im 6/46]
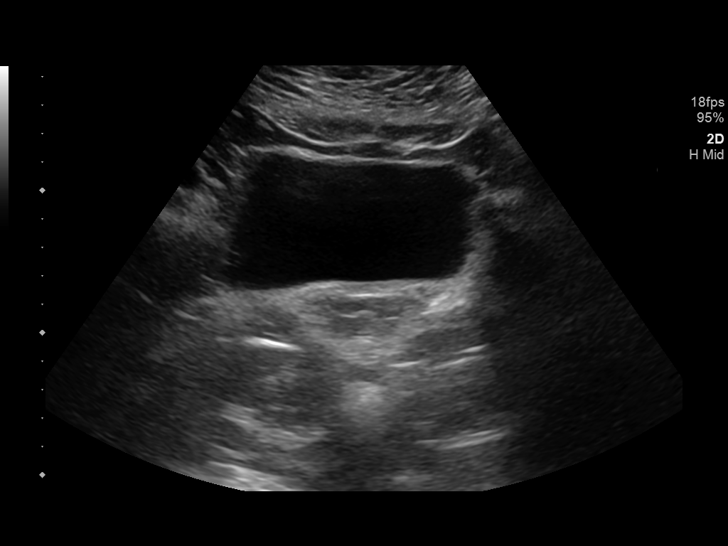
[im 9/46]
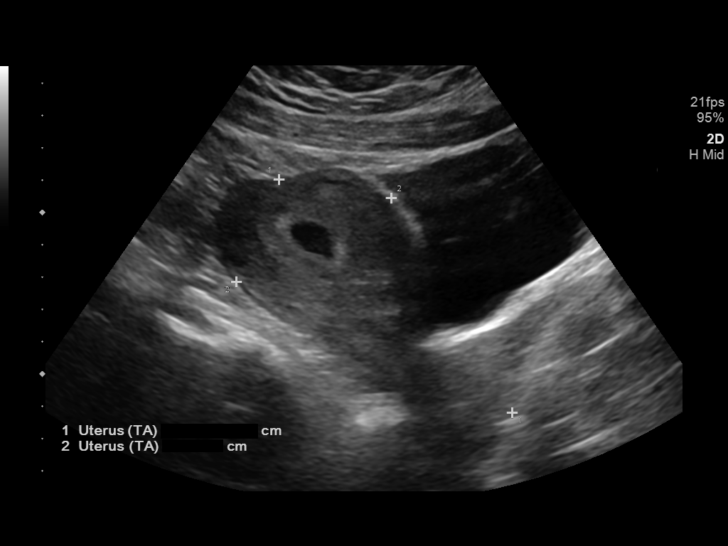
[im 12/46]
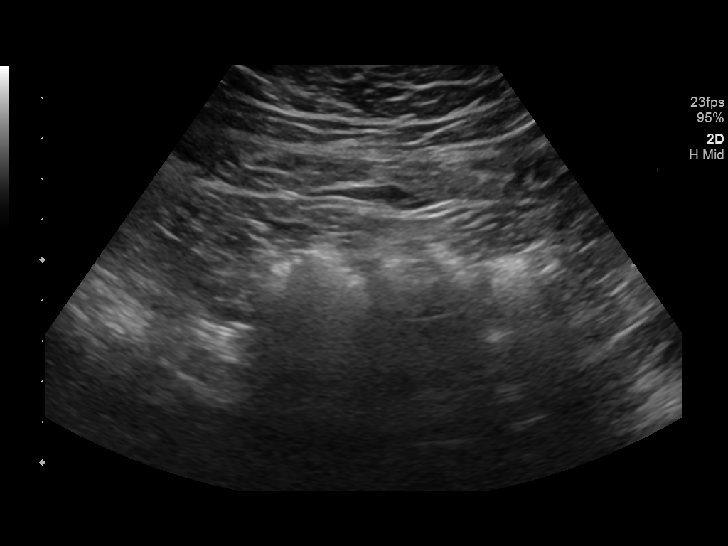
[im 16/46]
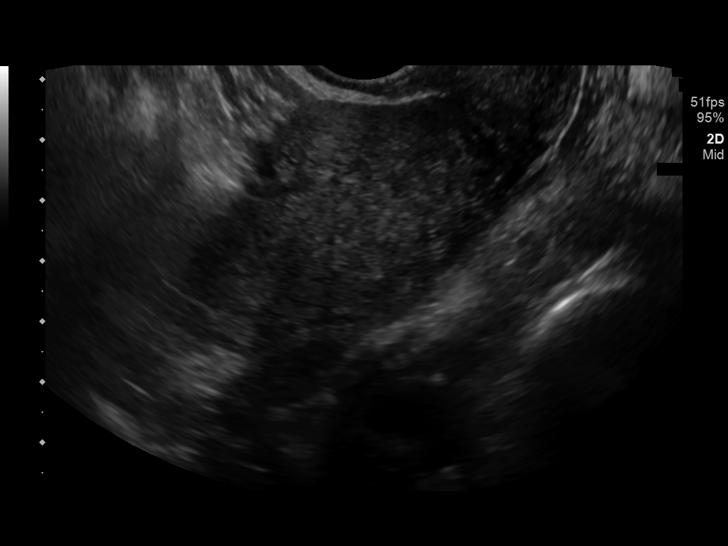
[im 19/46]
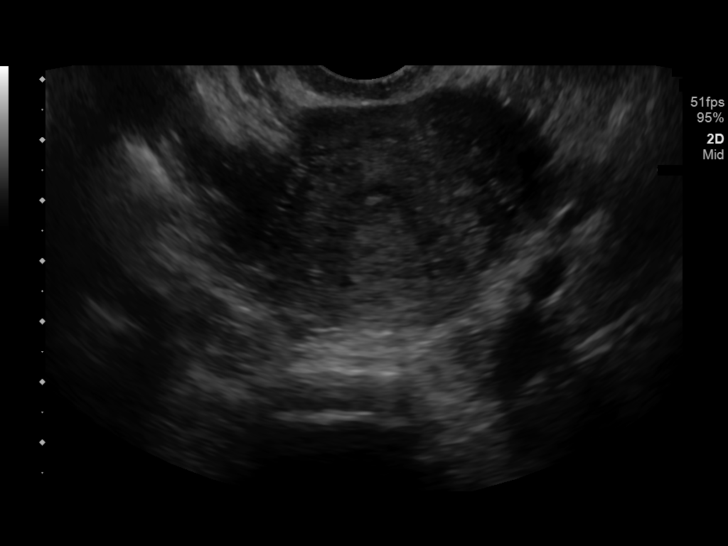
[im 22/46]
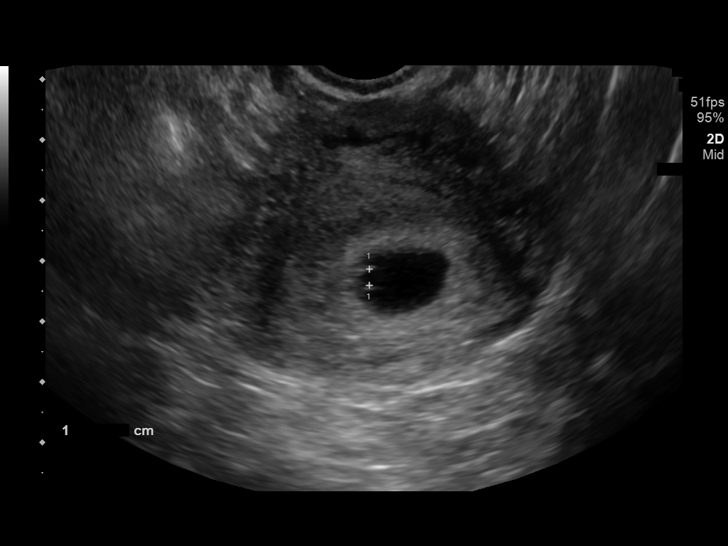
[im 26/46]
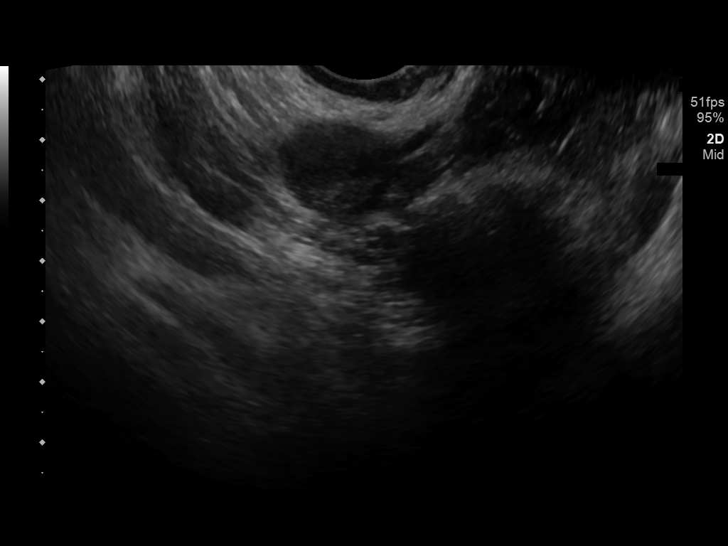
[im 29/46]
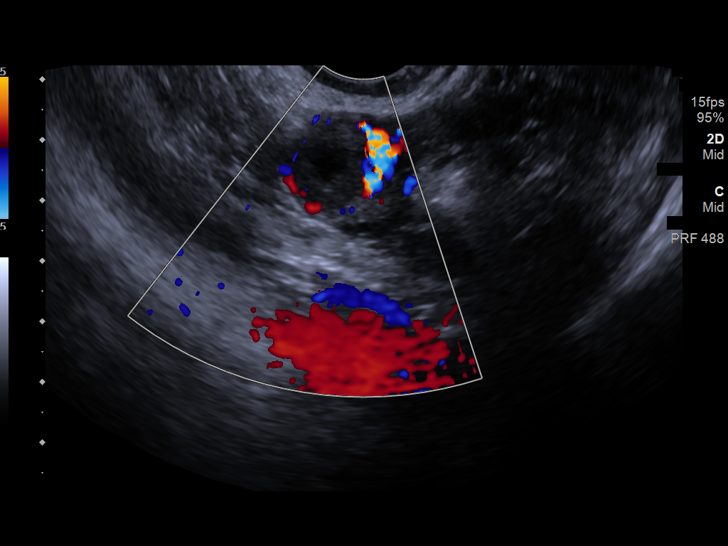
[im 32/46]
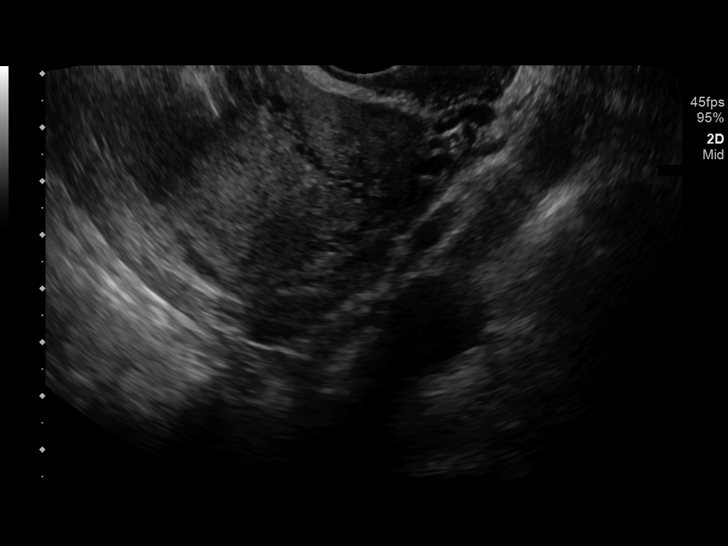
[im 36/46]
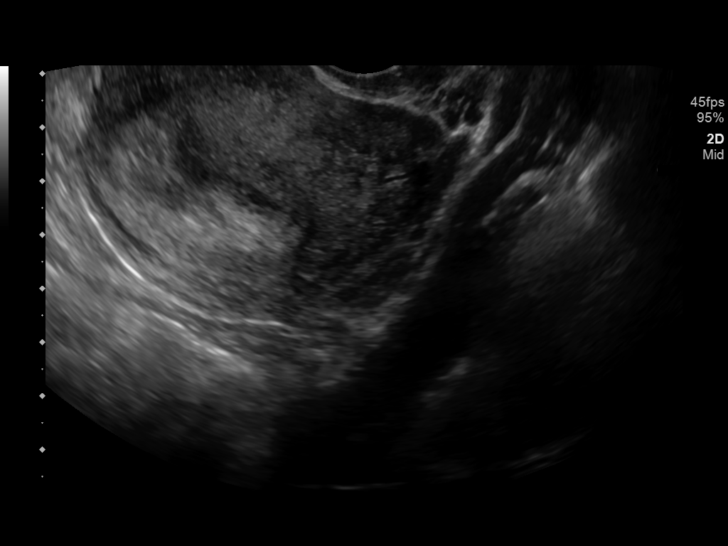
[im 39/46]
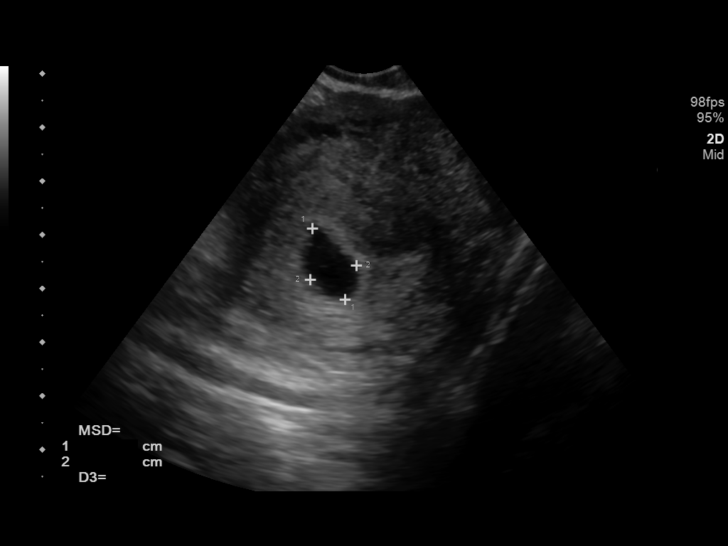
[im 42/46]
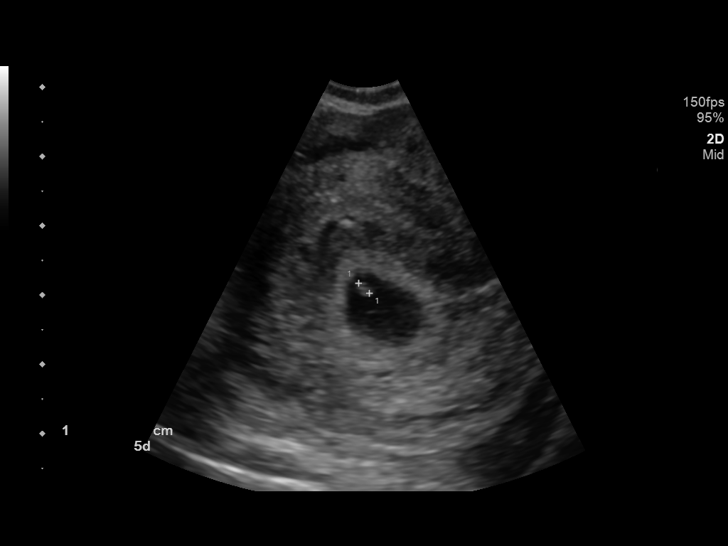
[im 46/46]
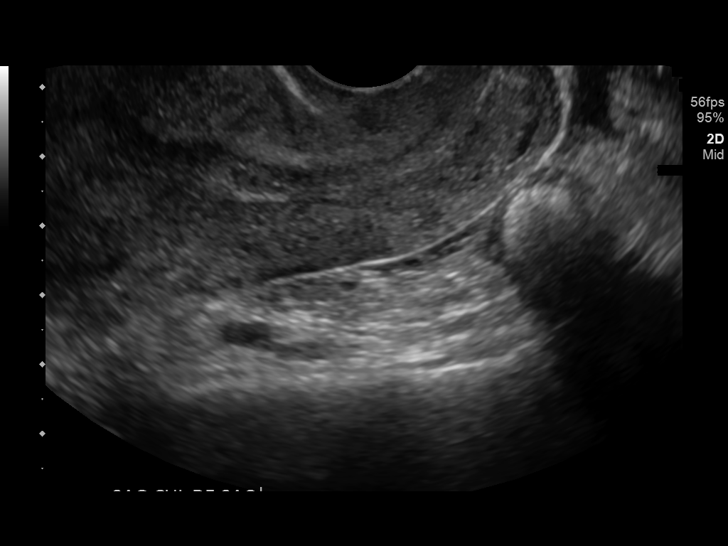

[14 of 28 positions shown; findings below may reference images not displayed]

FINDINGS: Intrauterine gestational sac: Single

Yolk sac:  Present

Embryo:  Present

Cardiac Activity: Present

Heart Rate: 105 bpm

CRL: 2 mm   5 w   5 d                  US EDC: 09/01/2021

Subchorionic hemorrhage:  None visualized.

Maternal uterus/adnexae: Small amount of free pelvic fluid.
Otherwise unremarkable.
IMPRESSION: 1.  Single viable intrauterine pregnancy at 5 weeks 5 days.

2.  Small amount of free fluid.

## 2023-01-16 IMAGING — US US OB TRANSVAGINAL
1 series · 14 of 28 positions shown · non-contrast
Comparison: None.

CLINICAL DATA: Bleeding and pelvic pain for 3 days, beta HCG 1,286

EXAM:
OBSTETRIC <14 WK US AND TRANSVAGINAL OB US
TECHNIQUE: Both transabdominal and transvaginal ultrasound examinations were
performed for complete evaluation of the gestation as well as the
maternal uterus, adnexal regions, and pelvic cul-de-sac.
Transvaginal technique was performed to assess early pregnancy.

[Series 1: us ob transvaginal · 97 acquisitions, 14 frames shown]
[im 4/97]
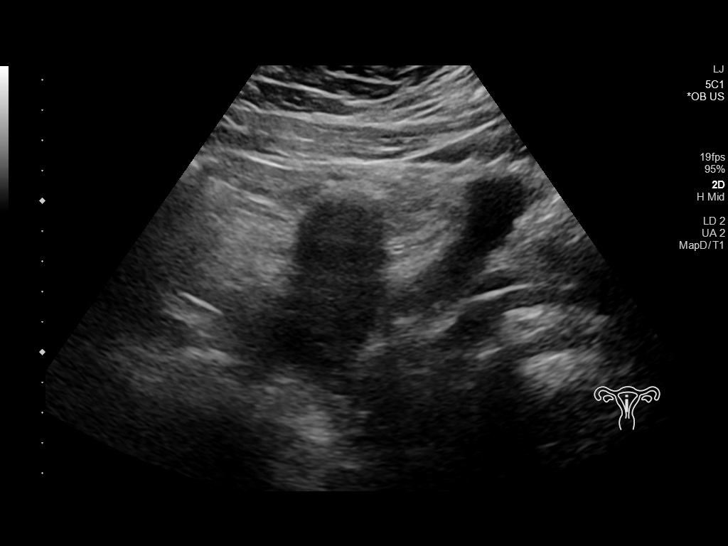
[im 11/97]
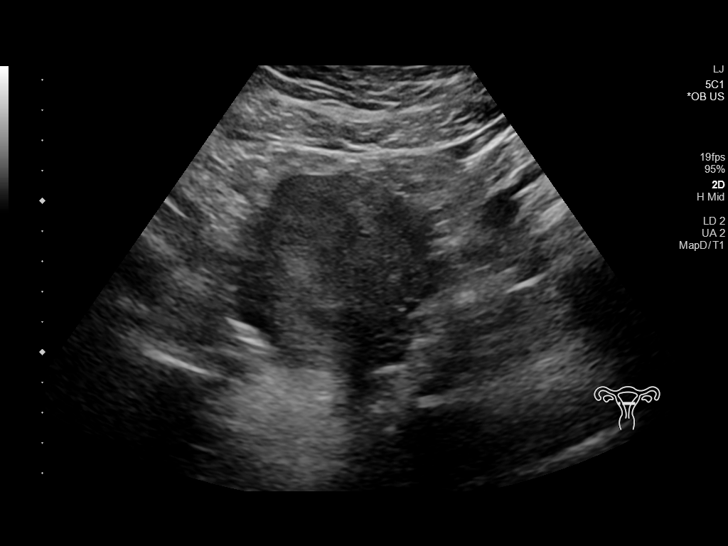
[im 18/97]
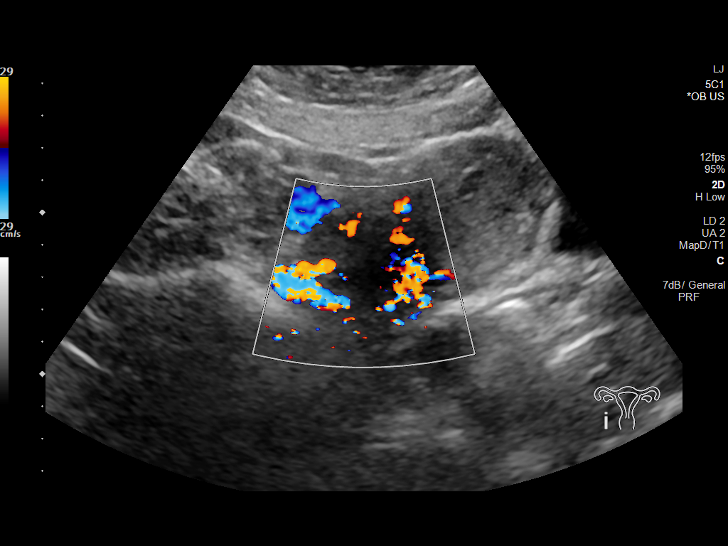
[im 25/97]
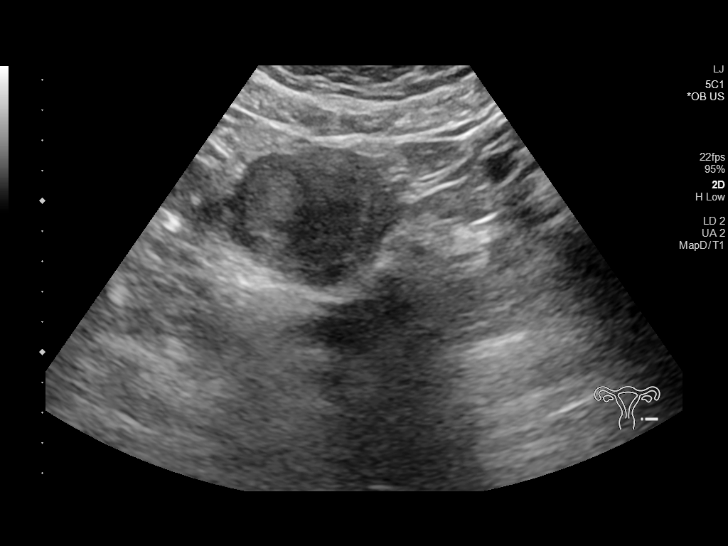
[im 33/97]
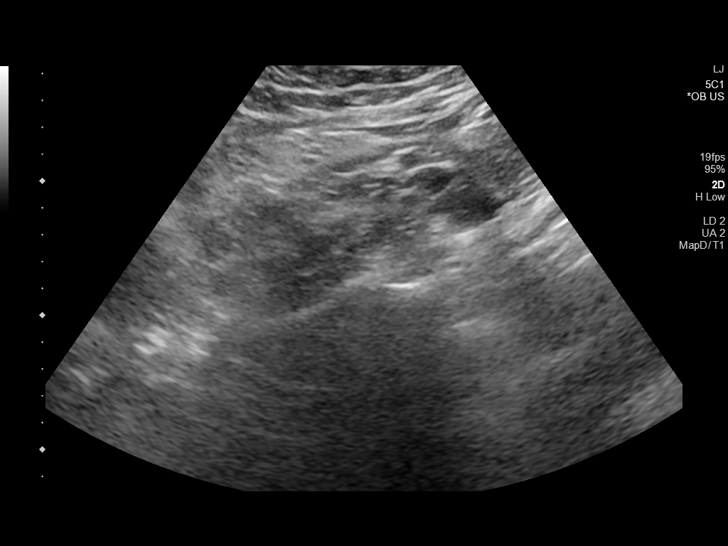
[im 40/97]
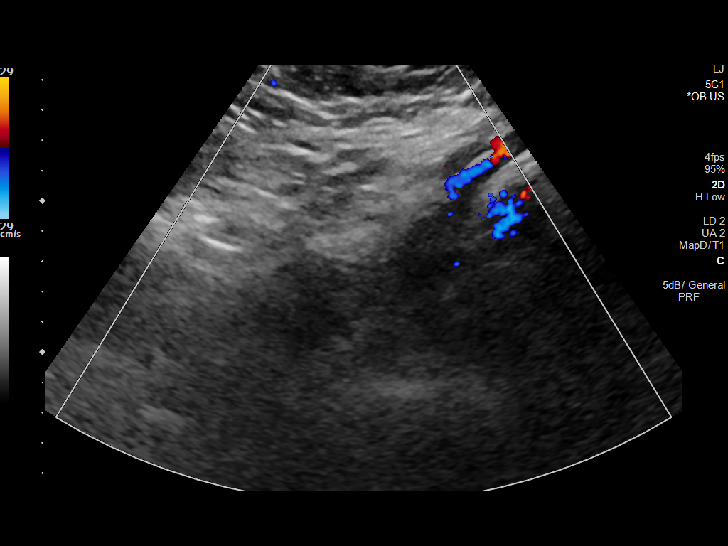
[im 47/97]
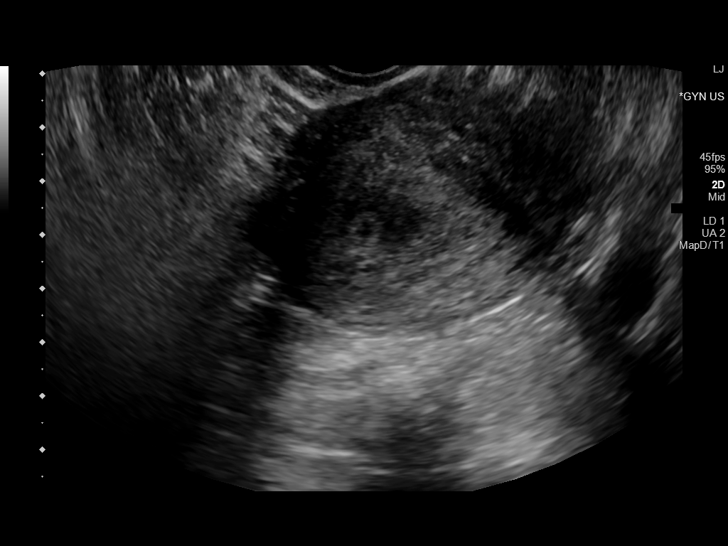
[im 54/97]
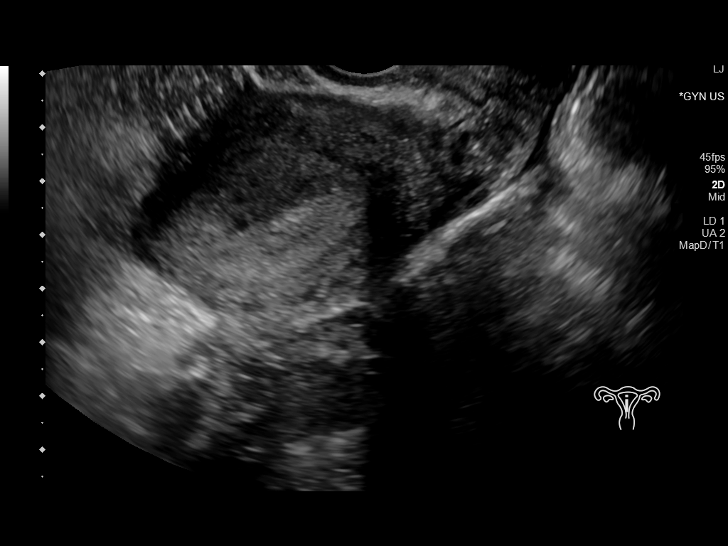
[im 61/97]
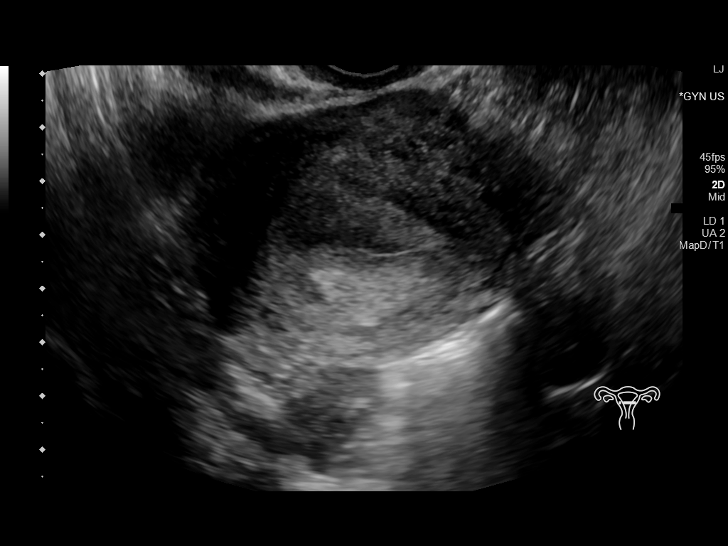
[im 68/97]
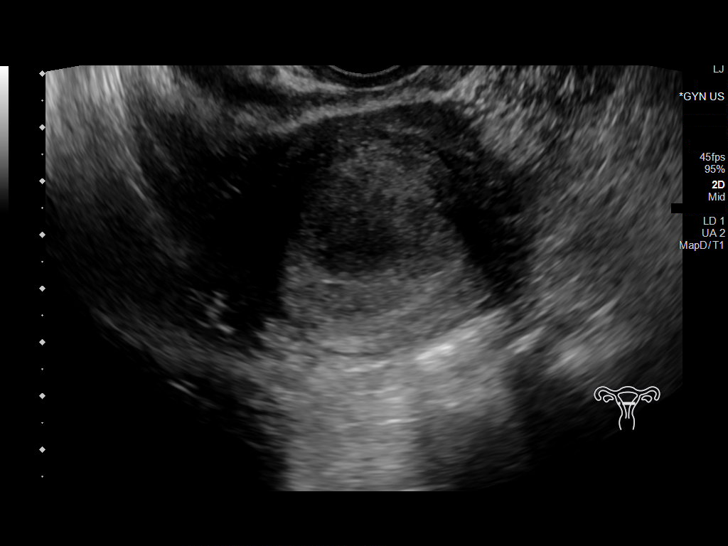
[im 75/97]
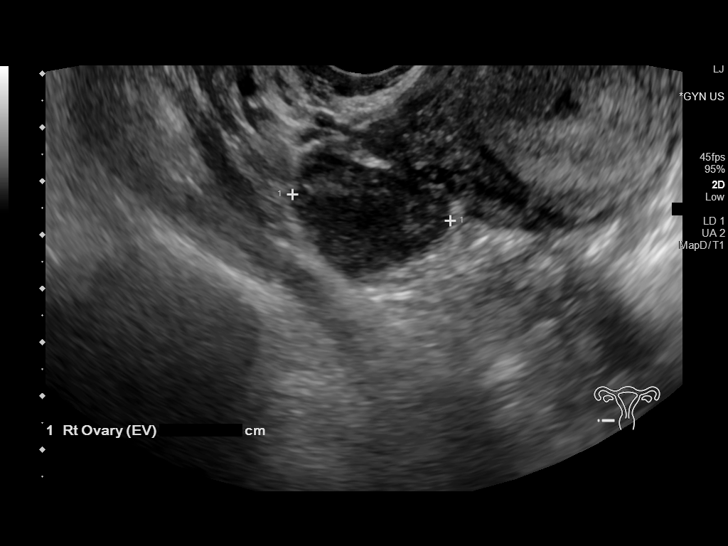
[im 82/97]
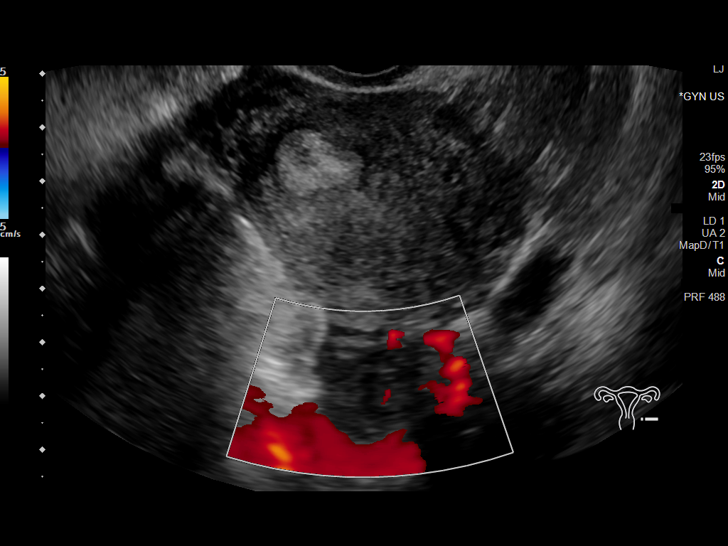
[im 89/97]
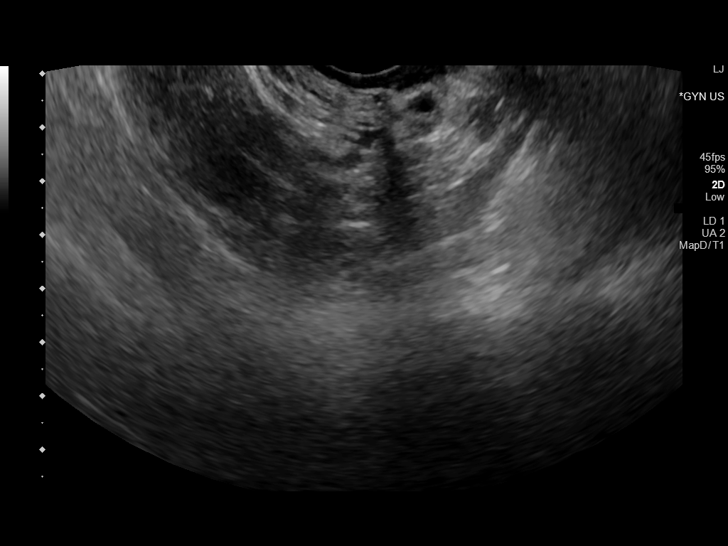
[im 97/97]
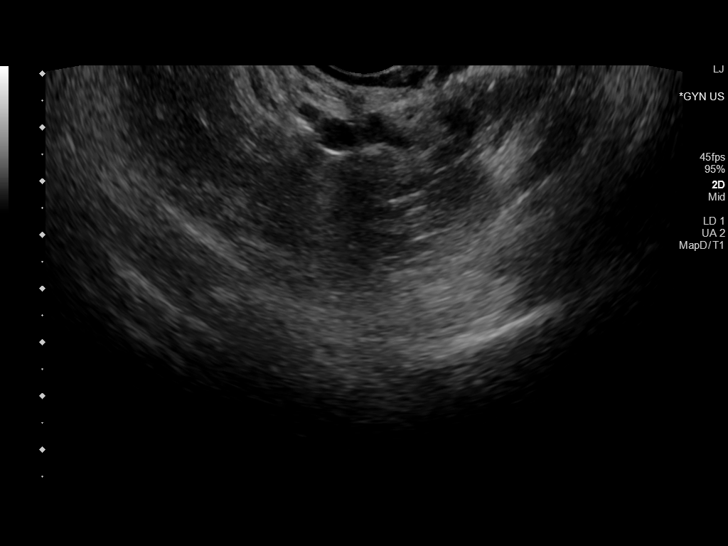

[14 of 28 positions shown; findings below may reference images not displayed]

FINDINGS: Intrauterine gestational sac: Single

Yolk sac:  Not Visualized.

Embryo:  Not Visualized.

Cardiac Activity: Not Visualized.

MSD: Too small for accurate measurement

Maternal uterus/adnexae: Right ovary measures 3.3 x 2.0 x 3.0 cm and
the left ovary measures 3.7 x 1.9 x 2.1 cm. Trace pelvic free fluid.
IMPRESSION: 1. Probable early intrauterine gestational sac, but no yolk sac,
fetal pole, or cardiac activity yet visualized. Recommend follow-up
quantitative B-HCG levels and follow-up US in 14 days to assess
viability. This recommendation follows SRU consensus guidelines:
Diagnostic Criteria for Nonviable Pregnancy Early in the First
Trimester. N Engl J Med 3681; [DATE].
2. Trace pelvic free fluid.

## 2023-01-16 IMAGING — US US OB COMP LESS 14 WK
1 series · 14 of 28 positions shown · non-contrast
Comparison: None.

CLINICAL DATA: Bleeding and pelvic pain for 3 days, beta HCG 1,286

EXAM:
OBSTETRIC <14 WK US AND TRANSVAGINAL OB US
TECHNIQUE: Both transabdominal and transvaginal ultrasound examinations were
performed for complete evaluation of the gestation as well as the
maternal uterus, adnexal regions, and pelvic cul-de-sac.
Transvaginal technique was performed to assess early pregnancy.

[Series 1: us ob comp less 14 wk · 97 acquisitions, 14 frames shown]
[im 4/97]
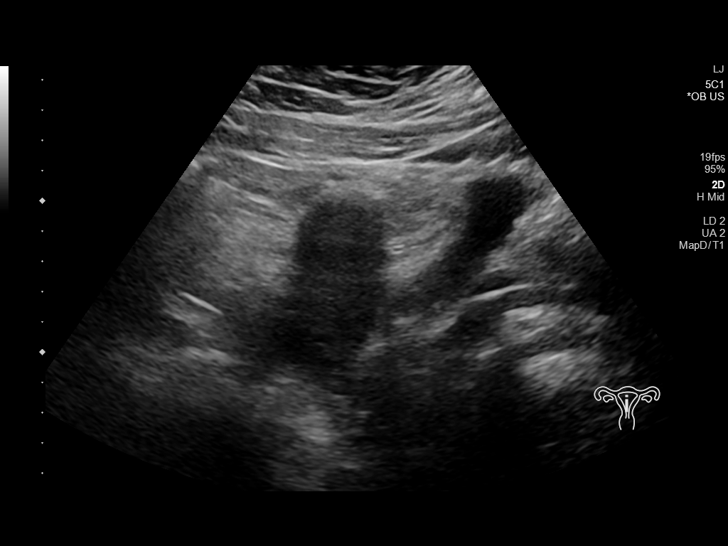
[im 11/97]
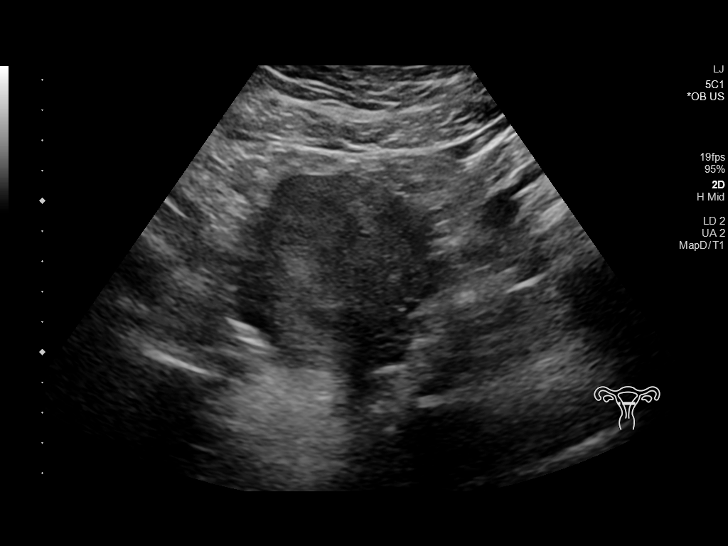
[im 18/97]
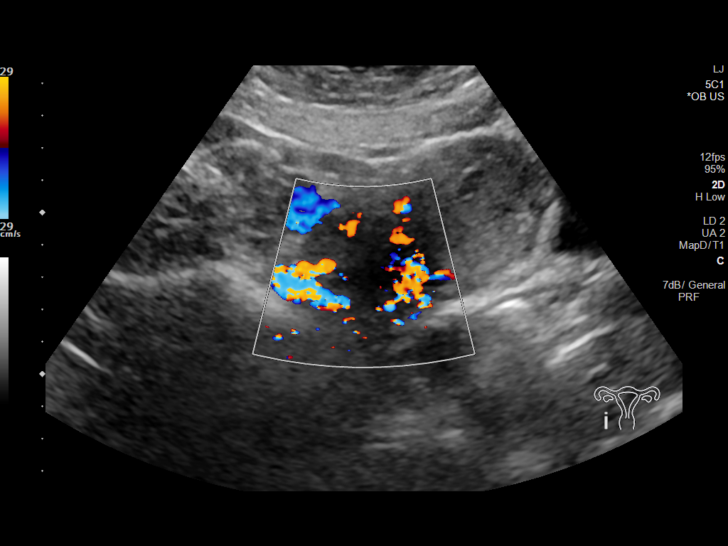
[im 25/97]
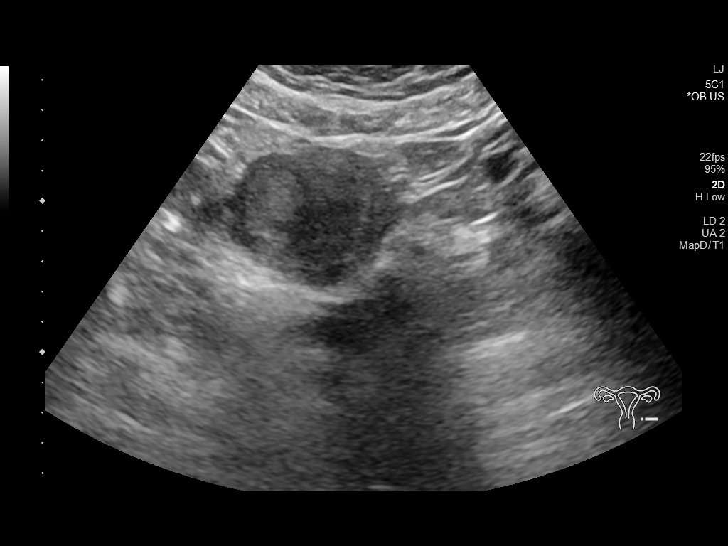
[im 33/97]
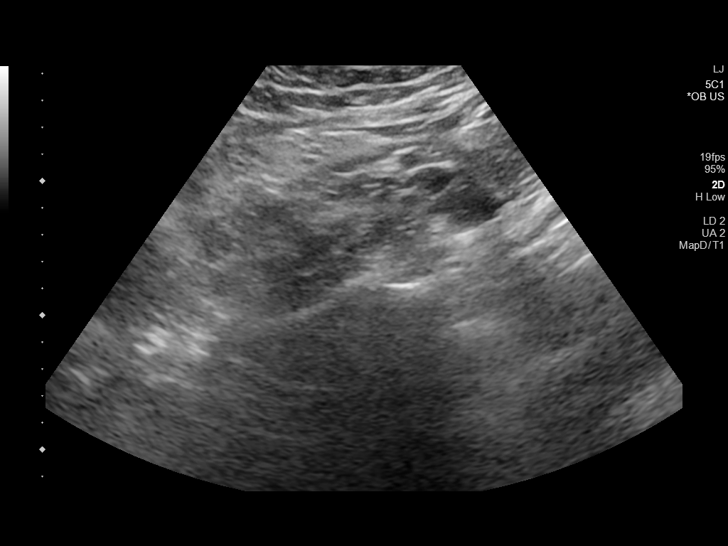
[im 40/97]
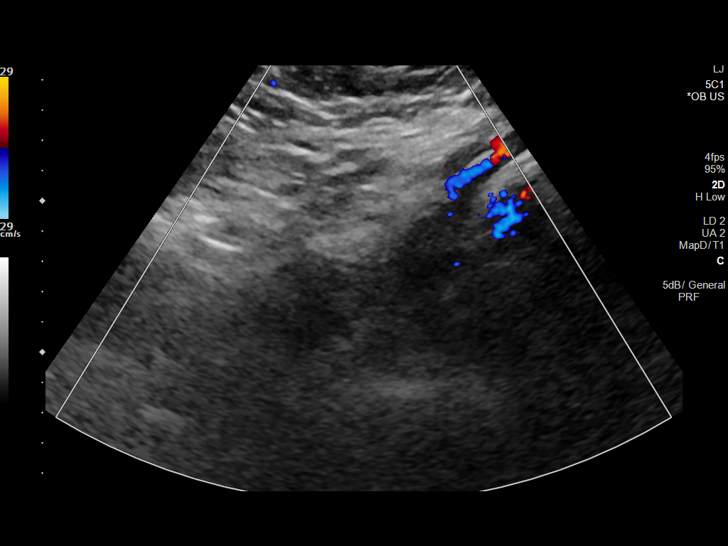
[im 47/97]
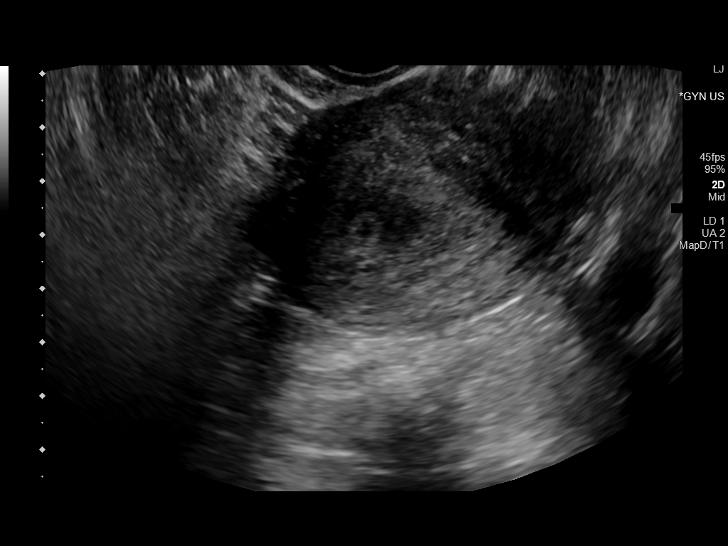
[im 54/97]
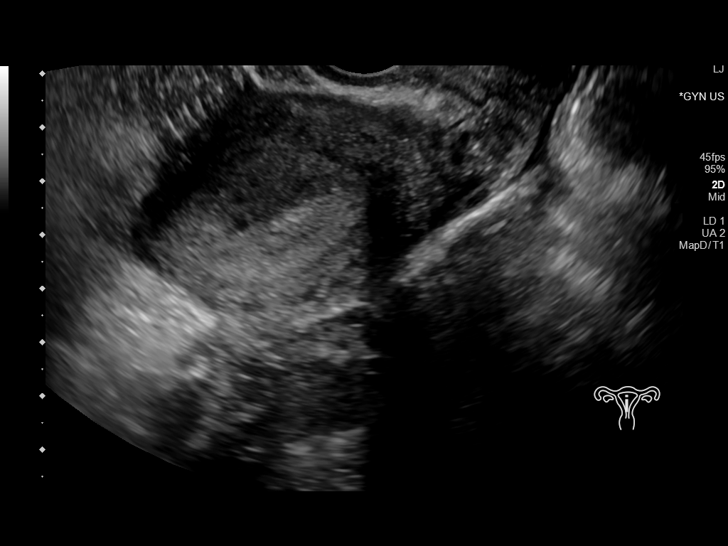
[im 61/97]
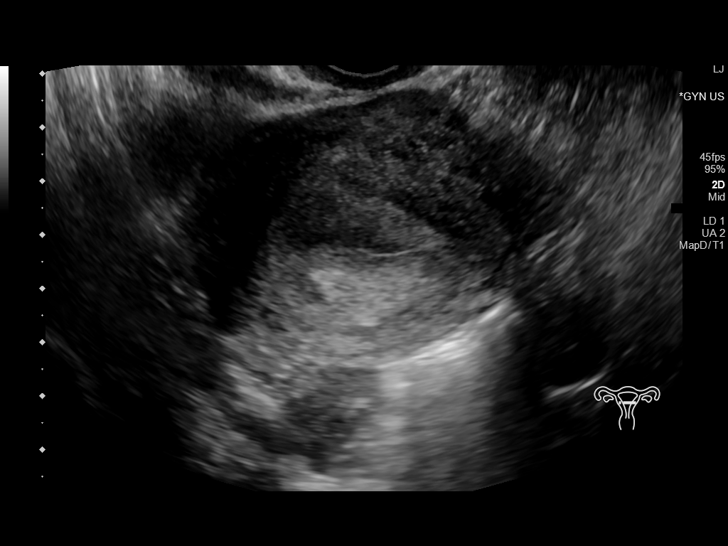
[im 68/97]
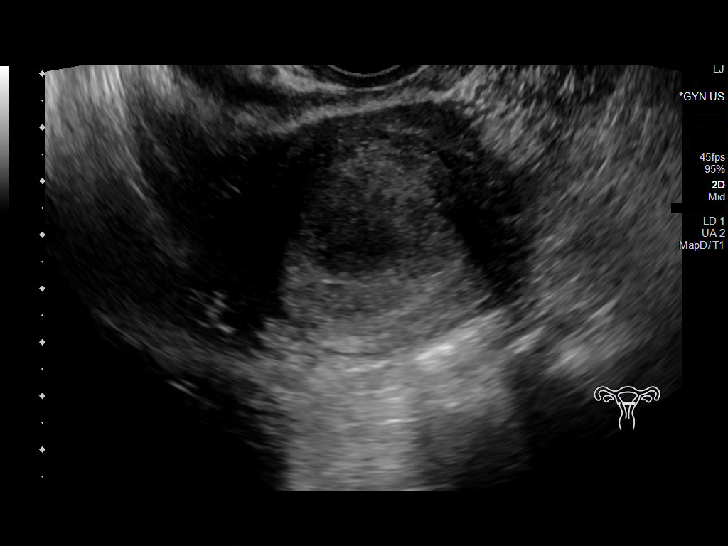
[im 75/97]
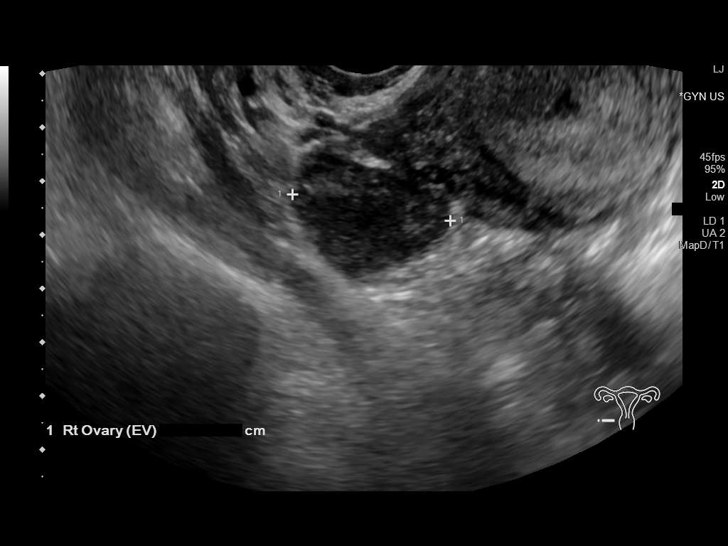
[im 82/97]
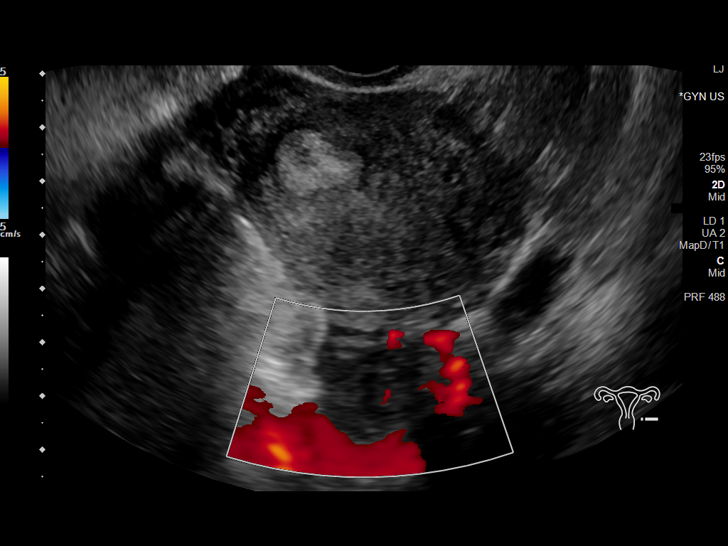
[im 89/97]
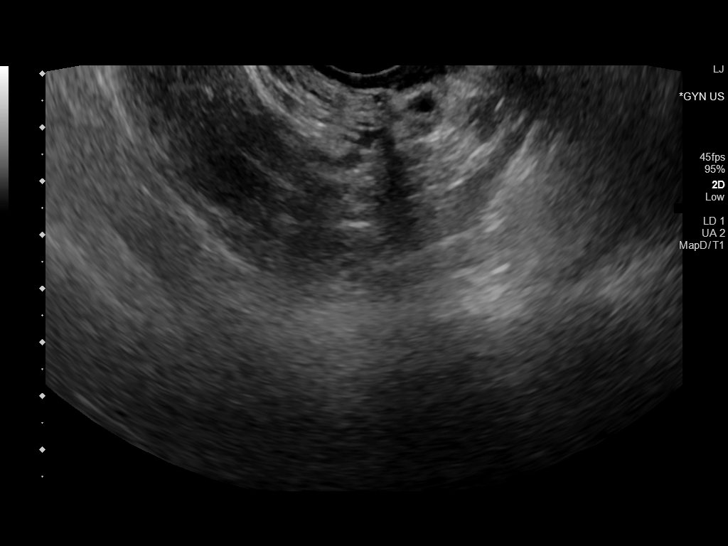
[im 97/97]
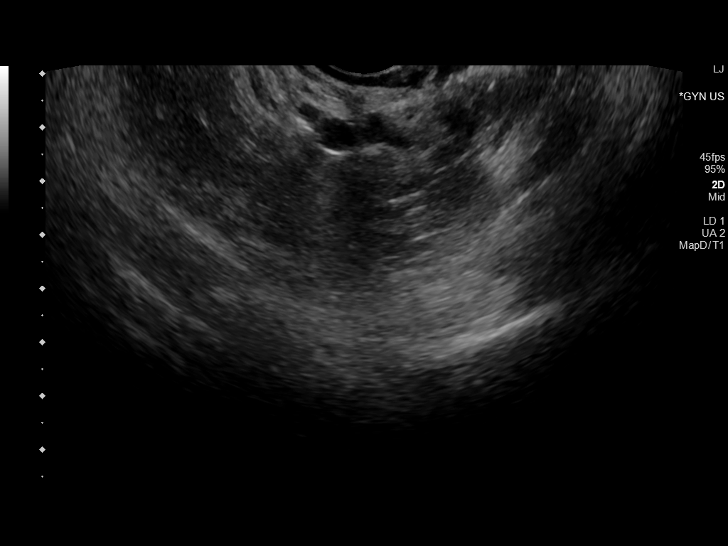

[14 of 28 positions shown; findings below may reference images not displayed]

FINDINGS: Intrauterine gestational sac: Single

Yolk sac:  Not Visualized.

Embryo:  Not Visualized.

Cardiac Activity: Not Visualized.

MSD: Too small for accurate measurement

Maternal uterus/adnexae: Right ovary measures 3.3 x 2.0 x 3.0 cm and
the left ovary measures 3.7 x 1.9 x 2.1 cm. Trace pelvic free fluid.
IMPRESSION: 1. Probable early intrauterine gestational sac, but no yolk sac,
fetal pole, or cardiac activity yet visualized. Recommend follow-up
quantitative B-HCG levels and follow-up US in 14 days to assess
viability. This recommendation follows SRU consensus guidelines:
Diagnostic Criteria for Nonviable Pregnancy Early in the First
Trimester. N Engl J Med 3681; [DATE].
2. Trace pelvic free fluid.

## 2023-01-31 IMAGING — US US OB TRANSVAGINAL
1 series · 15 of 28 positions shown · non-contrast
Comparison: Obstetrical ultrasound 06/26/2021.

CLINICAL DATA: Pregnant with bleeding.

EXAM:
TRANSVAGINAL OB ULTRASOUND
TECHNIQUE: Transvaginal ultrasound was performed for complete evaluation of the
gestation as well as the maternal uterus, adnexal regions, and
pelvic cul-de-sac.

[Series 1: us ob transvaginal mc & wl · 15 of 46 slices shown]
[im 1/46]
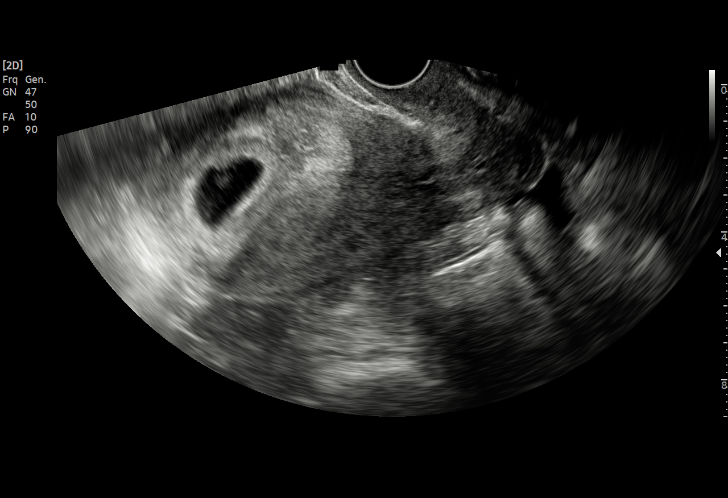
[im 4/46]
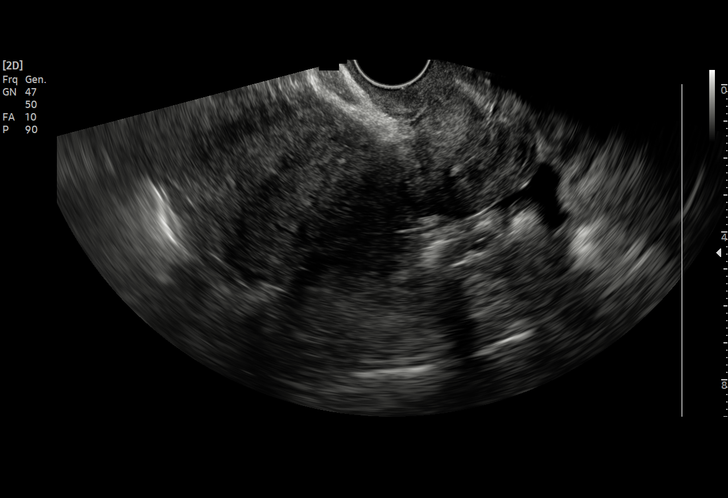
[im 7/46]
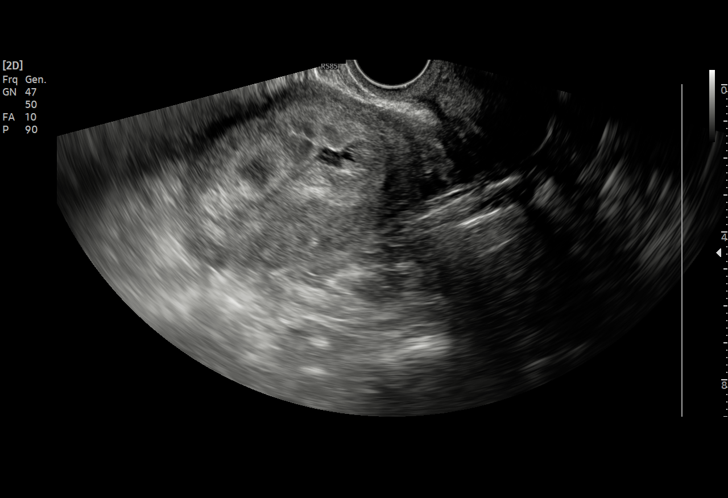
[im 11/46]
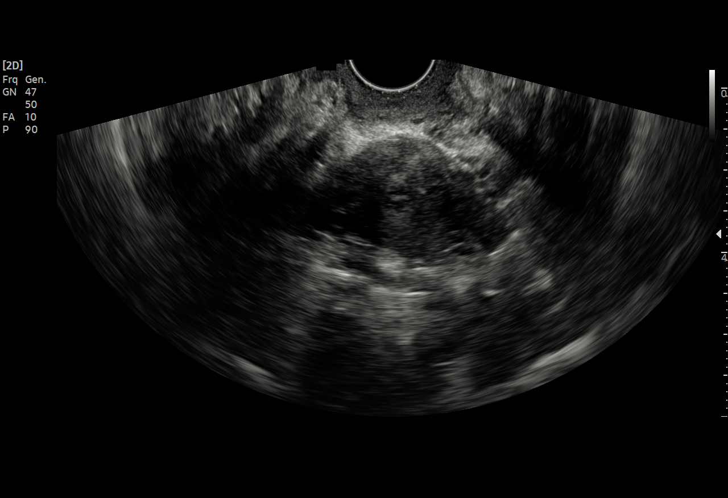
[im 14/46]
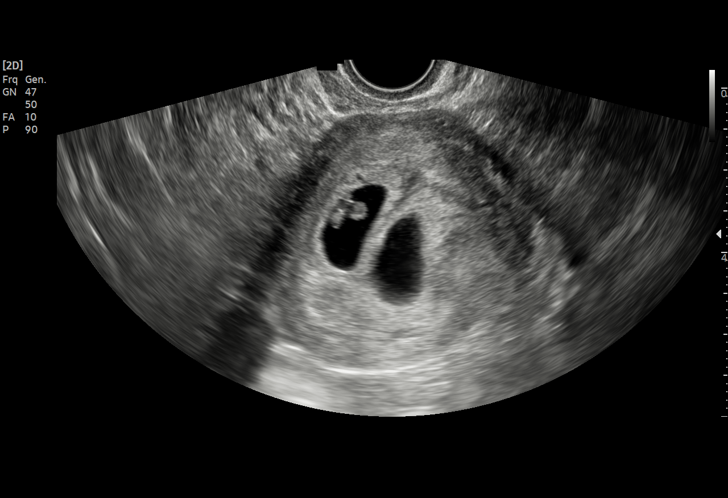
[im 17/46]
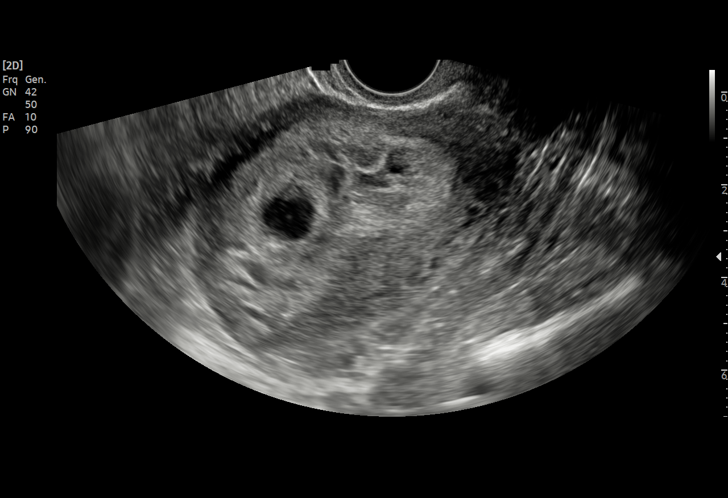
[im 21/46]
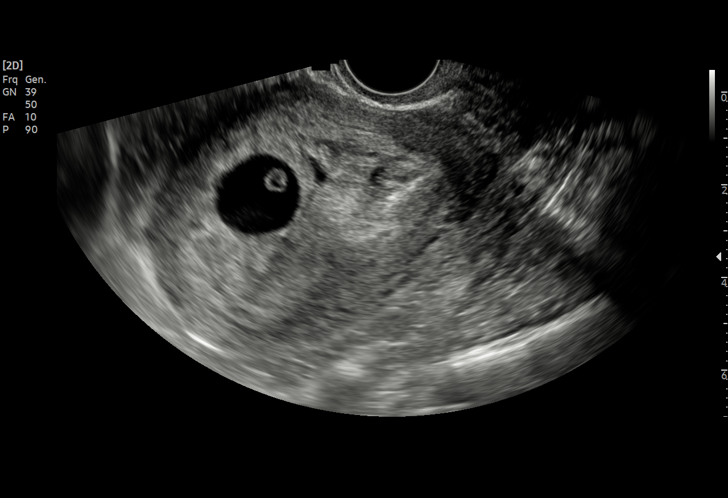
[im 24/46]
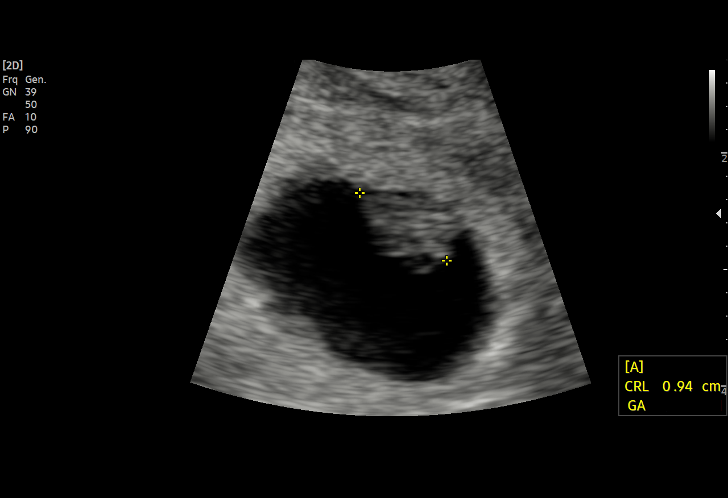
[im 26/46]
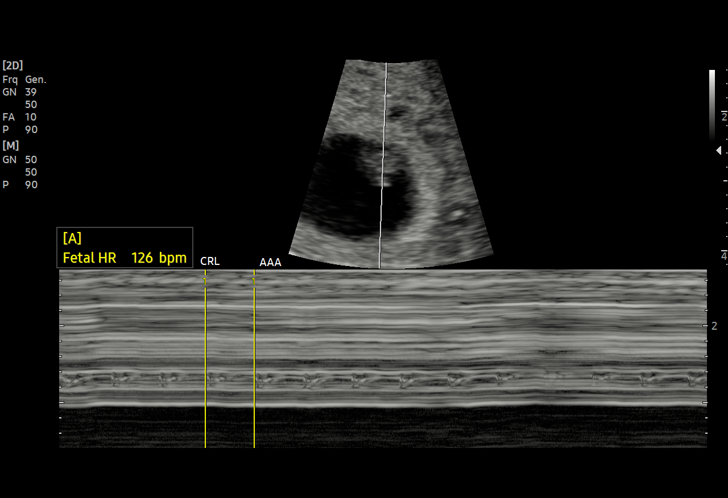
[im 29/46]
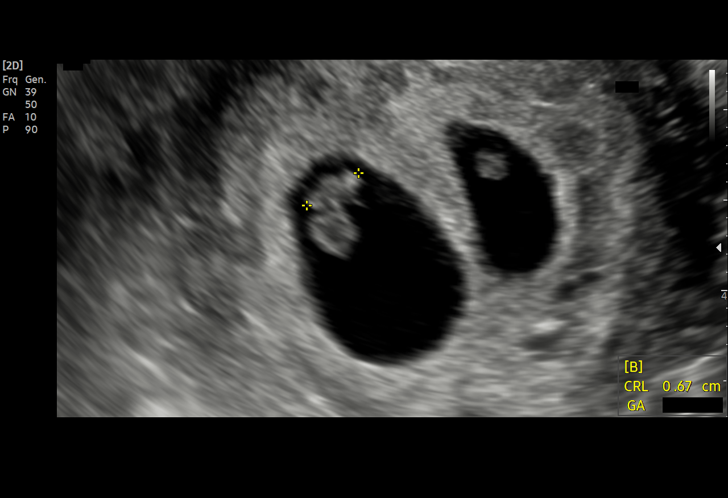
[im 32/46]
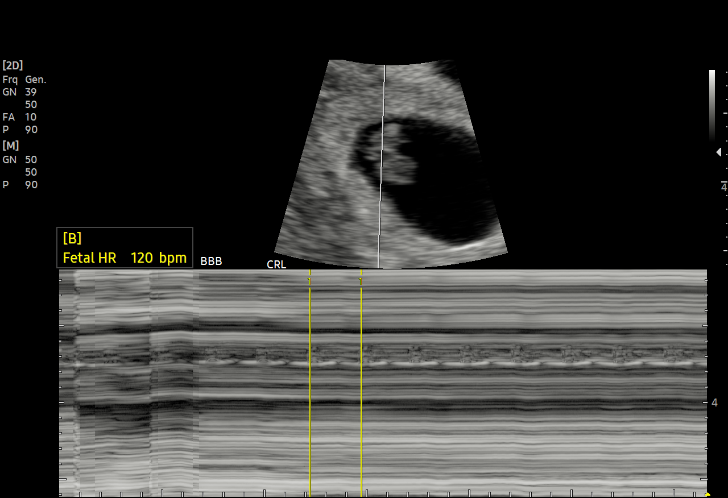
[im 36/46]
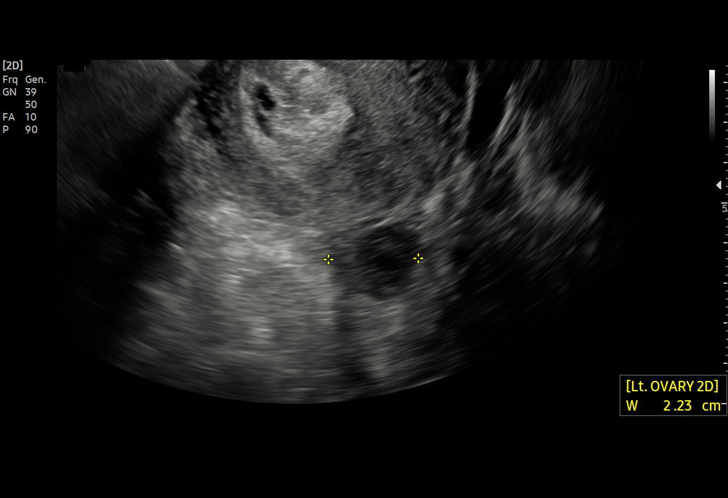
[im 39/46]
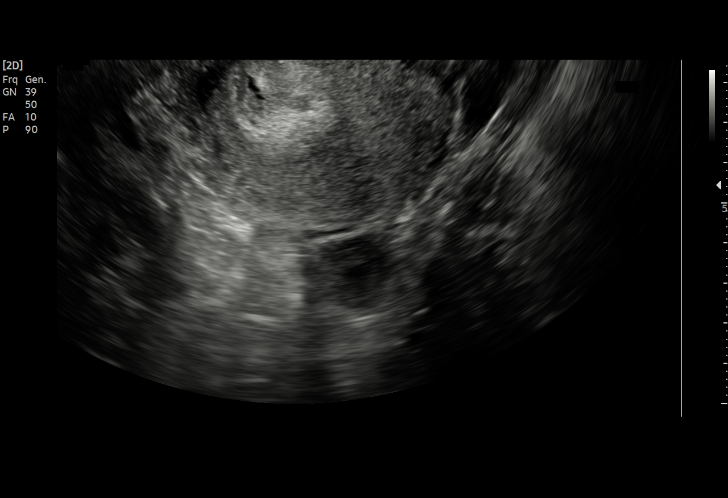
[im 42/46]
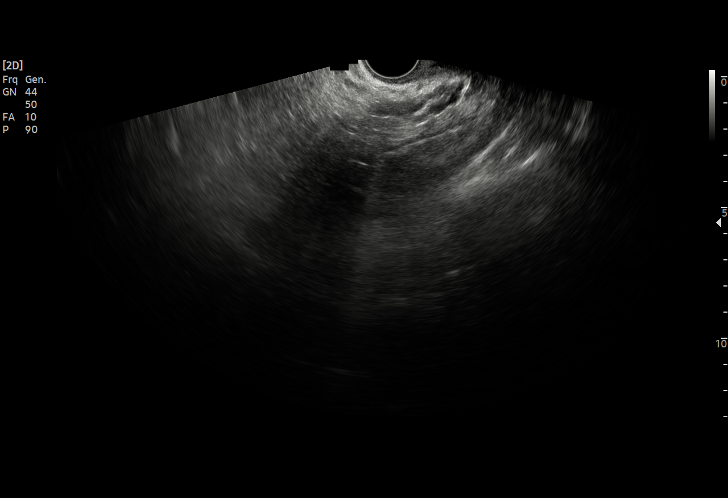
[im 46/46]
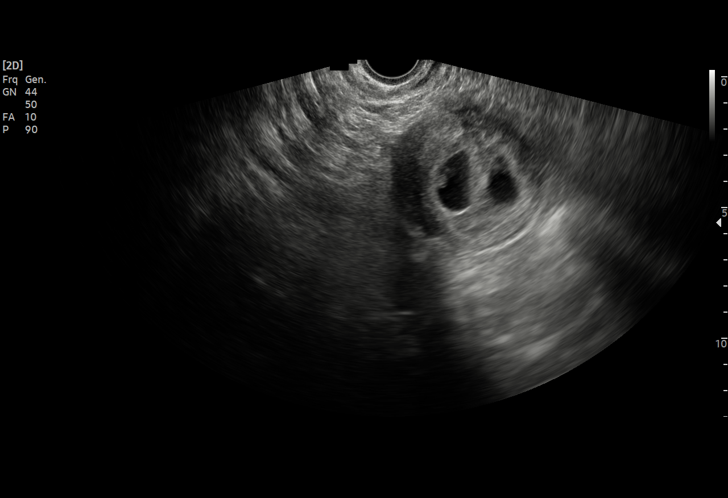

[15 of 28 positions shown; findings below may reference images not displayed]

FINDINGS: Intrauterine gestational sac: 2. Twin A located on the right and
twin B located on the left. There is a thick inter twin membrane
most consistent with dichorionic diamniotic gestation.

Twin A:

Yolk sac:  Visualized.

Embryo:  Visualized.

Cardiac Activity: Visualized.

Heart Rate: 126 bpm

CRL:   9.3 mm   7 w 0 d                  US EDC: 02/27/2022

Twin B:

Yolk sac:  Visualized.

Embryo:  Visualized.

Cardiac Activity: Visualized.

Heart Rate: 120 bpm

CRL:   6.7 mm   6 w 4 d                  US EDC: 03/02/2022

Subchorionic hemorrhage: Small subchorionic hemorrhage identified
inferior to the gestational sac.

Maternal uterus/adnexae: Right ovary nonvisualized. Left ovary
within normal limits. No pelvic free fluid.
IMPRESSION: 1. Live twin intrauterine gestation, likely dichorionic diamniotic.
2. Twin A measures 7 weeks 0 days.  Twin B measures 6 weeks 4 days.
3. Small subchorionic hemorrhage.

## 2023-02-20 ENCOUNTER — Encounter (HOSPITAL_COMMUNITY): Payer: Self-pay

## 2023-02-20 ENCOUNTER — Other Ambulatory Visit: Payer: Self-pay

## 2023-02-20 ENCOUNTER — Emergency Department (HOSPITAL_COMMUNITY)
Admission: EM | Admit: 2023-02-20 | Discharge: 2023-02-21 | Disposition: A | Discharge status: Home / Self Care | Payer: Medicaid Other | Attending: Emergency Medicine | Admitting: Emergency Medicine

## 2023-02-20 DIAGNOSIS — D649 Anemia, unspecified: Secondary | ICD-10-CM | POA: Diagnosis not present

## 2023-02-20 DIAGNOSIS — O99011 Anemia complicating pregnancy, first trimester: Secondary | ICD-10-CM | POA: Diagnosis not present

## 2023-02-20 DIAGNOSIS — O99810 Abnormal glucose complicating pregnancy: Secondary | ICD-10-CM | POA: Diagnosis not present

## 2023-02-20 DIAGNOSIS — R739 Hyperglycemia, unspecified: Secondary | ICD-10-CM | POA: Diagnosis not present

## 2023-02-20 DIAGNOSIS — O200 Threatened abortion: Secondary | ICD-10-CM | POA: Insufficient documentation

## 2023-02-20 DIAGNOSIS — O209 Hemorrhage in early pregnancy, unspecified: Secondary | ICD-10-CM | POA: Diagnosis present

## 2023-02-20 DIAGNOSIS — O99281 Endocrine, nutritional and metabolic diseases complicating pregnancy, first trimester: Secondary | ICD-10-CM | POA: Insufficient documentation

## 2023-02-20 DIAGNOSIS — Z3A01 Less than 8 weeks gestation of pregnancy: Secondary | ICD-10-CM | POA: Diagnosis not present

## 2023-02-20 DIAGNOSIS — O4690 Antepartum hemorrhage, unspecified, unspecified trimester: Secondary | ICD-10-CM

## 2023-02-20 LAB — CBC
HCT: 32.4 % — ABNORMAL LOW (ref 36.0–46.0)
Hemoglobin: 10.8 g/dL — ABNORMAL LOW (ref 12.0–15.0)
MCH: 28.7 pg (ref 26.0–34.0)
MCHC: 33.3 g/dL (ref 30.0–36.0)
MCV: 86.2 fL (ref 80.0–100.0)
Platelets: 310 10*3/uL (ref 150–400)
RBC: 3.76 MIL/uL — ABNORMAL LOW (ref 3.87–5.11)
RDW: 13.7 % (ref 11.5–15.5)
WBC: 9.5 10*3/uL (ref 4.0–10.5)
nRBC: 0 % (ref 0.0–0.2)

## 2023-02-20 LAB — BASIC METABOLIC PANEL
Anion gap: 11 (ref 5–15)
BUN: 10 mg/dL (ref 6–20)
CO2: 21 mmol/L — ABNORMAL LOW (ref 22–32)
Calcium: 8.6 mg/dL — ABNORMAL LOW (ref 8.9–10.3)
Chloride: 104 mmol/L (ref 98–111)
Creatinine, Ser: 0.74 mg/dL (ref 0.44–1.00)
GFR, Estimated: >60 mL/min (ref >60)
Glucose, Bld: 103 mg/dL — ABNORMAL HIGH (ref 70–99)
Potassium: 3.5 mmol/L (ref 3.5–5.1)
Sodium: 136 mmol/L (ref 135–145)

## 2023-02-20 NOTE — ED Triage Notes (Signed)
Pt arrives c/o vaginal bleeding/spotting since yesterday. Also c/o some R sided lower abdominal cramping. States that the bleeding has increased slightly since yesterday. Positive at home pregnancy test on 5/18. Denies hx of miscarriage, G1P1. Has not had an ultrasound yet.

## 2023-02-21 ENCOUNTER — Emergency Department (HOSPITAL_COMMUNITY): Payer: Medicaid Other

## 2023-02-21 LAB — URINALYSIS, ROUTINE W REFLEX MICROSCOPIC
Bilirubin Urine: NEGATIVE
Glucose, UA: NEGATIVE mg/dL
Hgb urine dipstick: NEGATIVE
Ketones, ur: NEGATIVE mg/dL
Leukocytes,Ua: NEGATIVE
Nitrite: NEGATIVE
Protein, ur: NEGATIVE mg/dL
Specific Gravity, Urine: 1.027 (ref 1.005–1.030)
pH: 6 (ref 5.0–8.0)

## 2023-02-21 LAB — HCG, QUANTITATIVE, PREGNANCY: hCG, Beta Chain, Quant, S: 13591 m[IU]/mL — ABNORMAL HIGH (ref <5)

## 2023-02-21 NOTE — ED Provider Notes (Addendum)
[place] EMERGENCY DEPARTMENT AT [facility] Provider Note   CSN: [id number] Arrival date & time: 02/20/23  2230     History Chief Complaint  Patient presents with   Vaginal Bleeding    Rhonda Cain is a 27 y.o. female ~G7P1 presents to the ER for evaluation of some vaginal spotting as well as lower abdominal cramping in the setting of positive pregnancy test.  Patient reports that she has been having a couple of sharp pains to her right lower abdomen/pelvic area over the past few days.  She started having some vaginal spotting yesterday, enough for 1 panty liner.  She reports that she has not increased today and is went through 2 panty liners.  Has not passed any clots.  She denies any dysuria.  Denies any nausea, vomiting, diarrhea, constipation, fever, or chills.  Denies any abnormal vaginal discharge.  She reports that the abdominal pain lasts less than a few seconds and is random without any exacerbating or relieving factors.  She is unsure how many times that she has been pregnant reports that she has had 1 elective abortion and thinks that she has had around 4-5 miscarriages.  She has 1 living child.  She had a positive pregnancy test that she reports was on 02-03-2023.  She is not taking any prenatal vitamins.  No known drug allergies.  Denies any tobacco, EtOH illicit drug use.   Vaginal Bleeding Associated symptoms: abdominal pain   Associated symptoms: no dysuria, no fever, no nausea and no vaginal discharge        Home Medications Prior to Admission medications   Medication Sig Start Date End Date Taking? Authorizing Provider  ondansetron (ZOFRAN ODT) 4 MG disintegrating tablet Take 1 tablet (4 mg total) by mouth every 8 (eight) hours as needed for nausea or vomiting. 01/04/21   Koleen Distance, MD      Allergies    Patient has no known allergies.    Review of Systems   Review of Systems  Constitutional:  Negative for chills and fever.  Respiratory:   Negative for shortness of breath.   Cardiovascular:  Negative for chest pain.  Gastrointestinal:  Positive for abdominal pain. Negative for blood in stool, constipation, diarrhea, nausea and vomiting.  Genitourinary:  Positive for vaginal bleeding. Negative for dysuria, hematuria and vaginal discharge.    Physical Exam Updated Vital Signs BP (!) 123/99 (BP Location: Left Arm)   Pulse 94   Temp 98.7 F (37.1 C) (Oral)   Resp 16   Ht 5\' 3"  (1.6 m)   Wt 97.5 kg   SpO2 98%   BMI 38.09 kg/m  Physical Exam Vitals and nursing note reviewed.  Constitutional:      Appearance: Normal appearance.  HENT:     Head: Normocephalic and atraumatic.  Eyes:     General: No scleral icterus. Cardiovascular:     Rate and Rhythm: Normal rate and regular rhythm.  Pulmonary:     Effort: Pulmonary effort is normal. No respiratory distress.  Abdominal:     General: Bowel sounds are normal.     Palpations: Abdomen is soft.     Tenderness: There is no abdominal tenderness. There is no right CVA tenderness, left CVA tenderness, guarding or rebound.  Genitourinary:    Comments: Pelvic exam declined by patient.  Musculoskeletal:        General: No deformity.     Cervical back: Normal range of motion.  Skin:    General: Skin is  warm and dry.  Neurological:     General: No focal deficit present.     Mental Status: She is alert. Mental status is at baseline.     ED Results / Procedures / Treatments   Labs (all labs ordered are listed, but only abnormal results are displayed) Labs Reviewed  CBC - Abnormal; Notable for the following components:      Result Value   RBC 3.76 (*)    Hemoglobin 10.8 (*)    HCT 32.4 (*)    All other components within normal limits  BASIC METABOLIC PANEL - Abnormal; Notable for the following components:   CO2 21 (*)    Glucose, Bld 103 (*)    Calcium 8.6 (*)    All other components within normal limits  HCG, QUANTITATIVE, PREGNANCY - Abnormal; Notable for the  following components:   hCG, Beta Chain, Quant, S 13,591 (*)    All other components within normal limits  URINALYSIS, ROUTINE W REFLEX MICROSCOPIC    EKG None  Radiology US OB Transvaginal  Result Date: 02/21/2023 CLINICAL DATA:  Abdominal pain and vaginal bleeding x1 day. EXAM: TWIN OBSTETRICAL ULTRASOUND <14 WKS TECHNIQUE: Transabdominal ultrasound was performed for evaluation of the gestation as well as the maternal uterus and adnexal regions. COMPARISON:  None Available. FINDINGS: Number of IUPs:  2 Chorionicity/Amnionicity:  Dichorionic-diamniotic (thick membrane) TWIN 1 Yolk sac:  Visualized. Embryo:  Visualized. Cardiac Activity: Visualized. Heart Rate: 117 bpm MSD: 11.2 mm   5 w   6 d CRL:   3.3 mm   6 w 0 d                  [place] EDC: October 17, 2023 TWIN 2 Yolk sac:  Not Visualized. Embryo:  Not Visualized. Cardiac Activity: Not Visualized. Heart Rate: N/A bpm MSD: 6.8 mm   5 w   3 d Subchorionic hemorrhage:  None visualized. Maternal uterus/adnexae: The right ovary measures 2.4 cm x 2.0 cm x 2.2 cm. A 1.4 cm x 1.3 cm x 1.2 cm complex right ovarian cyst is seen. The left ovary is not visualized. A small amount of pelvic free fluid is seen. Other: Limited exam secondary to the patient's body habitus. IMPRESSION: Twin intrauterine pregnancy with a viable Twin 1 (at 6 weeks and 0 days gestation by ultrasound evaluation) and probable early intrauterine gestational sac for Twin 2, but no yolk sac, fetal pole, or cardiac activity yet visualized. Recommend follow-up quantitative B-HCG levels and follow-up US in 14 days to assess viability. This recommendation follows SRU consensus guidelines: Diagnostic Criteria for Nonviable Pregnancy Early in the First Trimester. Malva Limes Med 2013; 161:0960-45. Electronically Signed   By: Aram Candela M.D.   On: 02/21/2023 01:39   US OB Comp < 14 Wks  Result Date: 02/21/2023 CLINICAL DATA:  Abdominal pain and vaginal bleeding x1 day. EXAM: TWIN OBSTETRICAL  ULTRASOUND <14 WKS TECHNIQUE: Transabdominal ultrasound was performed for evaluation of the gestation as well as the maternal uterus and adnexal regions. COMPARISON:  None Available. FINDINGS: Number of IUPs:  2 Chorionicity/Amnionicity:  Dichorionic-diamniotic (thick membrane) TWIN 1 Yolk sac:  Visualized. Embryo:  Visualized. Cardiac Activity: Visualized. Heart Rate: 117 bpm MSD: 11.2 mm   5 w   6 d CRL:   3.3 mm   6 w 0 d                  [place] EDC: October 17, 2023 TWIN 2 Yolk sac:  Not  Visualized. Embryo:  Not Visualized. Cardiac Activity: Not Visualized. Heart Rate: N/A bpm MSD: 6.8 mm   5 w   3 d Subchorionic hemorrhage:  None visualized. Maternal uterus/adnexae: The right ovary measures 2.4 cm x 2.0 cm x 2.2 cm. A 1.4 cm x 1.3 cm x 1.2 cm complex right ovarian cyst is seen. The left ovary is not visualized. A small amount of pelvic free fluid is seen. Other: Limited exam secondary to the patient's body habitus. IMPRESSION: Twin intrauterine pregnancy with a viable Twin 1 (at 6 weeks and 0 days gestation by ultrasound evaluation) and probable early intrauterine gestational sac for Twin 2, but no yolk sac, fetal pole, or cardiac activity yet visualized. Recommend follow-up quantitative B-HCG levels and follow-up US in 14 days to assess viability. This recommendation follows SRU consensus guidelines: Diagnostic Criteria for Nonviable Pregnancy Early in the First Trimester. Malva Limes Med 2013; 161:0960-45. Electronically Signed   By: Aram Candela M.D.   On: 02/21/2023 01:39    Procedures Procedures   Medications Ordered in ED Medications - No data to display  ED Course/ Medical Decision Making/ A&P                          Medical Decision Making Amount and/or Complexity of Data Reviewed Labs: ordered. Radiology: ordered.    27 y.o. female presents to the ER for evaluation of lower abdominal pain and vaginal bleeding in pregnancy. Differential diagnosis includes but is not limited to  threatened abortion, complete abortion, ectopic pregnancy, vaginal bleeding in pregnancy. Vital signs show slightly elevated blood pressure 123/99 otherwise unremarkable. Physical exam as noted above.   Labs and ultrasound ordered.  I independently reviewed and interpreted the patient's labs.  BMP shows slightly decreased bicarb at 21 however normal anion gap.  Glucose mildly elevated at 103 and calcium mildly decreased at 8.6.  Otherwise, no electrolyte abnormality.  CBC does show mild anemia at 10.8, does appear to be around her baseline.  Leukocytosis.  Urinalysis unremarkable for any infection.  hCG is elevated at 13,591.  Ultrasound shows Twin intrauterine pregnancy with a viable Twin 1 (at 6 weeks and 0 days gestation by ultrasound evaluation) and probable early intrauterine gestational sac for Twin 2, but no yolk sac, fetal pole, or cardiac activity yet visualized. Recommend follow-up quantitative B-HCG levels and follow-up US in 14 days to assess viability.   She reports that she did have some vaginal bleeding with her son.  From looking at her previous records, it does show that her blood type is O+.  She would not need any RhoGAM.  Ultrasound does show 2 intrauterine pregnancies.  She declined a GU exam.  I have lower suction for any appendicitis or other intra-abdominal etiology given that her abdomen is soft and nontender.  Likely experiencing some pain with the vaginal bleeding or from pregnancy.  She has leukocytosis and her vital signs are stable than some slightly elevated blood pressure.  Urine does not show any signs of urinary tract infection.  The patient is only having a small amount of vaginal bleeding, she is not saturating pads or passing large clots.  Patient will need follow-up with ultrasound and trending beta-hCG.  She has good follow-up with [facility] as she was recently seen by them a few days prior.  We discussed the need to follow-up with OB/GYN for ultrasound testing and  repeat hCG trending. I stressed the importance of prenatal vitamins and encouraged  her to start taking them.   We discussed the results of the labs/imaging. The plan is follow up with OBGYN for repeat HCG and [place]. We discussed strict return precautions and red flag symptoms. The patient verbalized their understanding and agrees to the plan. The patient is stable and being discharged home in good condition.  Portions of this report may have been transcribed using voice recognition software. Every effort was made to ensure accuracy; however, inadvertent computerized transcription errors may be present.   Final Clinical Impression(s) / ED Diagnoses Final diagnoses:  Vaginal bleeding in pregnancy  Threatened miscarriage    Rx / DC Orders ED Discharge Orders     None         Achille Rich, PA-C 02/21/23 0550    Achille Rich, PA-C 02/21/23 1610    Shon Baton, MD 02/21/23 [phone]

## 2023-02-21 NOTE — Discharge Instructions (Addendum)
You were seen in the emergency room today for evaluation of bleeding during pregnancy.  Your lab work shows that you are anemic as well as being pregnant.  Your ultrasound shows that you are pregnant with twins.  Please make sure you follow-up with your OB GYN for repeat testing of your hCG or pregnancy levels as well as a follow-up ultrasound within the next 2 weeks.  Please make sure you call them tomorrow to schedule this.  If you have any increased vaginal bleeding such as bleeding 1 pad per hour for 2 hours consecutively, please return to the nearest emergency department for evaluation.  If you have any worsening belly pain, nausea, vomiting, pain with urination or any black or bloody stools, please make sure to return to the nearest emergency department for evaluation. If you have any concerns, new or worsening symptoms, please return to the ER immediately.   Contact a doctor if: You are pregnant and you have one of these: Light bleeding coming from your vagina. Spots of blood coming from your vagina. You have belly pain or cramping. You have a fever. Get help right away if: Blood soaks through 2 large pads an hour for more than 2 hours. Clots of blood come from your vagina. Tissue comes out of your vagina. Fluid leaks or gushes from your vagina. You have very bad pain in your low back. You have very bad cramps in your belly. You have a fever, chills, and very bad belly pain.
